# Patient Record
Sex: Male | Born: 1970
Health system: Southern US, Community
[De-identification: ages and names within clinical notes are randomized; demographics above are authoritative.]

## PROBLEM LIST (undated history)

## (undated) DIAGNOSIS — J449 Chronic obstructive pulmonary disease, unspecified: Secondary | ICD-10-CM

## (undated) DIAGNOSIS — I10 Essential (primary) hypertension: Secondary | ICD-10-CM

## (undated) DIAGNOSIS — J9692 Respiratory failure, unspecified with hypercapnia: Secondary | ICD-10-CM

## (undated) DIAGNOSIS — I509 Heart failure, unspecified: Secondary | ICD-10-CM

## (undated) HISTORY — DX: Chronic obstructive pulmonary disease, unspecified: J44.9

## (undated) HISTORY — DX: Heart failure, unspecified: I50.9

## (undated) HISTORY — PX: OTHER SURGICAL HISTORY: SHX169

## (undated) HISTORY — DX: Respiratory failure, unspecified with hypercapnia: J96.92

---

## 2015-06-01 ENCOUNTER — Inpatient Hospital Stay (HOSPITAL_BASED_OUTPATIENT_CLINIC_OR_DEPARTMENT_OTHER)
Admission: EM | Admit: 2015-06-01 | Discharge: 2015-06-11 | DRG: 291 | Disposition: A | Discharge status: Home / Self Care | Payer: Self-pay | Attending: Internal Medicine | Admitting: Internal Medicine

## 2015-06-01 ENCOUNTER — Encounter (HOSPITAL_BASED_OUTPATIENT_CLINIC_OR_DEPARTMENT_OTHER): Payer: Self-pay

## 2015-06-01 ENCOUNTER — Emergency Department (HOSPITAL_BASED_OUTPATIENT_CLINIC_OR_DEPARTMENT_OTHER): Payer: Self-pay

## 2015-06-01 DIAGNOSIS — I161 Hypertensive emergency: Secondary | ICD-10-CM | POA: Insufficient documentation

## 2015-06-01 DIAGNOSIS — R5383 Other fatigue: Secondary | ICD-10-CM | POA: Insufficient documentation

## 2015-06-01 DIAGNOSIS — I509 Heart failure, unspecified: Secondary | ICD-10-CM

## 2015-06-01 DIAGNOSIS — G4733 Obstructive sleep apnea (adult) (pediatric): Secondary | ICD-10-CM | POA: Diagnosis present

## 2015-06-01 DIAGNOSIS — R609 Edema, unspecified: Secondary | ICD-10-CM

## 2015-06-01 DIAGNOSIS — E662 Morbid (severe) obesity with alveolar hypoventilation: Secondary | ICD-10-CM | POA: Diagnosis present

## 2015-06-01 DIAGNOSIS — T380X5A Adverse effect of glucocorticoids and synthetic analogues, initial encounter: Secondary | ICD-10-CM | POA: Diagnosis present

## 2015-06-01 DIAGNOSIS — I160 Hypertensive urgency: Secondary | ICD-10-CM | POA: Diagnosis present

## 2015-06-01 DIAGNOSIS — I248 Other forms of acute ischemic heart disease: Secondary | ICD-10-CM | POA: Diagnosis present

## 2015-06-01 DIAGNOSIS — M7120 Synovial cyst of popliteal space [Baker], unspecified knee: Secondary | ICD-10-CM | POA: Diagnosis present

## 2015-06-01 DIAGNOSIS — J9601 Acute respiratory failure with hypoxia: Secondary | ICD-10-CM | POA: Diagnosis present

## 2015-06-01 DIAGNOSIS — R32 Unspecified urinary incontinence: Secondary | ICD-10-CM | POA: Diagnosis present

## 2015-06-01 DIAGNOSIS — F17200 Nicotine dependence, unspecified, uncomplicated: Secondary | ICD-10-CM | POA: Diagnosis present

## 2015-06-01 DIAGNOSIS — I110 Hypertensive heart disease with heart failure: Principal | ICD-10-CM | POA: Diagnosis present

## 2015-06-01 DIAGNOSIS — R601 Generalized edema: Secondary | ICD-10-CM

## 2015-06-01 DIAGNOSIS — I429 Cardiomyopathy, unspecified: Secondary | ICD-10-CM | POA: Diagnosis present

## 2015-06-01 DIAGNOSIS — D72829 Elevated white blood cell count, unspecified: Secondary | ICD-10-CM | POA: Diagnosis present

## 2015-06-01 DIAGNOSIS — I10 Essential (primary) hypertension: Secondary | ICD-10-CM

## 2015-06-01 DIAGNOSIS — I472 Ventricular tachycardia: Secondary | ICD-10-CM | POA: Diagnosis not present

## 2015-06-01 DIAGNOSIS — R0902 Hypoxemia: Secondary | ICD-10-CM | POA: Insufficient documentation

## 2015-06-01 DIAGNOSIS — J9602 Acute respiratory failure with hypercapnia: Secondary | ICD-10-CM

## 2015-06-01 DIAGNOSIS — Z720 Tobacco use: Secondary | ICD-10-CM

## 2015-06-01 DIAGNOSIS — J9621 Acute and chronic respiratory failure with hypoxia: Secondary | ICD-10-CM | POA: Diagnosis present

## 2015-06-01 DIAGNOSIS — Z9119 Patient's noncompliance with other medical treatment and regimen: Secondary | ICD-10-CM | POA: Diagnosis present

## 2015-06-01 DIAGNOSIS — I5043 Acute on chronic combined systolic (congestive) and diastolic (congestive) heart failure: Secondary | ICD-10-CM | POA: Diagnosis present

## 2015-06-01 DIAGNOSIS — I5041 Acute combined systolic (congestive) and diastolic (congestive) heart failure: Secondary | ICD-10-CM | POA: Diagnosis present

## 2015-06-01 DIAGNOSIS — I272 Other secondary pulmonary hypertension: Secondary | ICD-10-CM | POA: Diagnosis present

## 2015-06-01 DIAGNOSIS — I2781 Cor pulmonale (chronic): Secondary | ICD-10-CM | POA: Diagnosis present

## 2015-06-01 DIAGNOSIS — Z9114 Patient's other noncompliance with medication regimen: Secondary | ICD-10-CM | POA: Diagnosis present

## 2015-06-01 DIAGNOSIS — J9622 Acute and chronic respiratory failure with hypercapnia: Secondary | ICD-10-CM | POA: Diagnosis present

## 2015-06-01 DIAGNOSIS — F17210 Nicotine dependence, cigarettes, uncomplicated: Secondary | ICD-10-CM | POA: Diagnosis present

## 2015-06-01 DIAGNOSIS — J441 Chronic obstructive pulmonary disease with (acute) exacerbation: Secondary | ICD-10-CM | POA: Diagnosis present

## 2015-06-01 DIAGNOSIS — D751 Secondary polycythemia: Secondary | ICD-10-CM | POA: Diagnosis present

## 2015-06-01 HISTORY — DX: Essential (primary) hypertension: I10

## 2015-06-01 LAB — CBC WITH DIFFERENTIAL/PLATELET
BASOS ABS: 0.1 10*3/uL (ref 0.0–0.1)
BASOS PCT: 1 % (ref 0–1)
Eosinophils Absolute: 0.5 10*3/uL (ref 0.0–0.7)
Eosinophils Relative: 4 % (ref 0–5)
HEMOGLOBIN: 17.2 g/dL — AB (ref 13.0–17.0)
LYMPHS ABS: 3.1 10*3/uL (ref 0.7–4.0)
LYMPHS PCT: 25 % (ref 12–46)
MCH: 29.6 pg (ref 26.0–34.0)
MCHC: 31 g/dL (ref 30.0–36.0)
MCV: 95.2 fL (ref 78.0–100.0)
Monocytes Absolute: 1.2 10*3/uL — ABNORMAL HIGH (ref 0.1–1.0)
Monocytes Relative: 9 % (ref 3–12)
NEUTROS PCT: 61 % (ref 43–77)
Neutro Abs: 7.6 10*3/uL (ref 1.7–7.7)
PLATELETS: 229 10*3/uL (ref 150–400)
RBC: 5.82 MIL/uL — ABNORMAL HIGH (ref 4.22–5.81)
RDW: 15.6 % — ABNORMAL HIGH (ref 11.5–15.5)
WBC: 12.4 10*3/uL — AB (ref 4.0–10.5)

## 2015-06-01 LAB — URINALYSIS, ROUTINE W REFLEX MICROSCOPIC
BILIRUBIN URINE: NEGATIVE
Glucose, UA: NEGATIVE mg/dL
Hgb urine dipstick: NEGATIVE
Ketones, ur: NEGATIVE mg/dL
Leukocytes, UA: NEGATIVE
Nitrite: NEGATIVE
PH: 6.5 (ref 5.0–8.0)
PROTEIN: 30 mg/dL — AB
SPECIFIC GRAVITY, URINE: 1.009 (ref 1.005–1.030)
UROBILINOGEN UA: 0.2 mg/dL (ref 0.0–1.0)

## 2015-06-01 LAB — BRAIN NATRIURETIC PEPTIDE: B Natriuretic Peptide: 315.6 pg/mL — ABNORMAL HIGH (ref 0.0–100.0)

## 2015-06-01 LAB — COMPREHENSIVE METABOLIC PANEL
ALT: 43 U/L (ref 17–63)
ANION GAP: 17 — AB (ref 5–15)
AST: 40 U/L (ref 15–41)
Albumin: 3.3 g/dL — ABNORMAL LOW (ref 3.5–5.0)
Alkaline Phosphatase: 79 U/L (ref 38–126)
BILIRUBIN TOTAL: 0.8 mg/dL (ref 0.3–1.2)
BUN: 20 mg/dL (ref 6–20)
CO2: 36 mmol/L — ABNORMAL HIGH (ref 22–32)
Calcium: 9.8 mg/dL (ref 8.9–10.3)
Chloride: 92 mmol/L — ABNORMAL LOW (ref 101–111)
Creatinine, Ser: 0.86 mg/dL (ref 0.61–1.24)
GFR calc Af Amer: >60 mL/min (ref >60)
GLUCOSE: 110 mg/dL — AB (ref 65–99)
Potassium: 4.3 mmol/L (ref 3.5–5.1)
Sodium: 145 mmol/L (ref 135–145)
Total Protein: 6.2 g/dL — ABNORMAL LOW (ref 6.5–8.1)

## 2015-06-01 LAB — TROPONIN I: TROPONIN I: 0.07 ng/mL — AB (ref <0.031)

## 2015-06-01 LAB — URINE MICROSCOPIC-ADD ON: Urine-Other: NONE SEEN

## 2015-06-01 MED ORDER — NITROGLYCERIN IN D5W 200-5 MCG/ML-% IV SOLN
10.0000 ug/min | INTRAVENOUS | Status: DC
  Administered 2015-06-01: 5 ug/min via INTRAVENOUS
  Administered 2015-06-02: 20 ug/min via INTRAVENOUS
  Filled 2015-06-01 (×2): qty 250

## 2015-06-01 MED ORDER — NICOTINE 21 MG/24HR TD PT24
21.0000 mg | MEDICATED_PATCH | Freq: Every day | TRANSDERMAL | Status: DC
  Administered 2015-06-01 – 2015-06-11 (×11): 21 mg via TRANSDERMAL
  Filled 2015-06-01 (×12): qty 1

## 2015-06-01 MED ORDER — FUROSEMIDE 10 MG/ML IJ SOLN
40.0000 mg | Freq: Once | INTRAMUSCULAR | Status: AC
  Administered 2015-06-01: 40 mg via INTRAVENOUS
  Filled 2015-06-01: qty 4

## 2015-06-01 MED ORDER — SODIUM CHLORIDE 0.9 % IV SOLN
INTRAVENOUS | Status: DC
  Administered 2015-06-01: 21:00:00 via INTRAVENOUS

## 2015-06-01 MED ORDER — HYDRALAZINE HCL 20 MG/ML IJ SOLN
10.0000 mg | Freq: Once | INTRAMUSCULAR | Status: AC
  Administered 2015-06-01: 10 mg via INTRAVENOUS
  Filled 2015-06-01: qty 1

## 2015-06-01 MED ORDER — ONDANSETRON HCL 4 MG/2ML IJ SOLN: 4.0000 mg | Freq: Three times a day (TID) | INTRAMUSCULAR | Status: DC | PRN

## 2015-06-01 MED ORDER — ASPIRIN 81 MG PO CHEW
324.0000 mg | CHEWABLE_TABLET | Freq: Once | ORAL | Status: AC
  Administered 2015-06-01: 324 mg via ORAL
  Filled 2015-06-01: qty 4

## 2015-06-01 MED ORDER — OXYCODONE-ACETAMINOPHEN 5-325 MG PO TABS
2.0000 | ORAL_TABLET | Freq: Once | ORAL | Status: AC
  Administered 2015-06-02: 2 via ORAL
  Filled 2015-06-01: qty 2

## 2015-06-01 NOTE — ED Provider Notes (Addendum)
Patient seen and evaluated. Care discussed with PA. Has peripheral edema. Dyspnea with conversation. 80% sats on room air. Wheezing on exam. His dry weight 284 he is 299 tonight. I agree with admission diuresis and blood pressure control. He is placed on nitroglycerin drip. Given IV Lasix. Given IV hydralazine. I discussed the case with Dr. Julian Reil. My PA has discussed the case with Dr. Nicholaus Bloom of cardiology. Plan will be transferred to [facility] for admission, further testing, cardiology consultation.  CRITICAL CARE Performed by: Rolland Porter JOSEPH   Total critical care time: 30 minutes.  Initiation of NTG qtt for HTN Urgency  Critical care time was exclusive of separately billable procedures and treating other patients.  Critical care was necessary to treat or prevent imminent or life-threatening deterioration.  Critical care was time spent personally by me on the following activities: development of treatment plan with patient and/or surrogate as well as nursing, discussions with consultants, evaluation of patient's response to treatment, examination of patient, obtaining history from patient or surrogate, ordering and performing treatments and interventions, ordering and review of laboratory studies, ordering and review of radiographic studies, pulse oximetry and re-evaluation of patient's condition.   Rolland Porter, MD 06/01/15 2137  Rolland Porter, MD 06/05/15 [phone]

## 2015-06-01 NOTE — ED Notes (Signed)
Pt c/o bilateral leg swelling with sharp throbbing pain from knee down; pts o2 sat 84% ra, states no medical hx, doesn't go to PCP

## 2015-06-01 NOTE — ED Provider Notes (Signed)
CSN: [id number]     Arrival date & time 06/01/15  1910 History   First MD Initiated Contact with Patient 06/01/15 1928     Chief Complaint  Patient presents with  . Leg Swelling    o2 sat 84, c/o SOB on exertion      (Consider location/radiation/quality/duration/timing/severity/associated sxs/prior Treatment) HPI   Blood pressure 192/117, pulse 96, temperature 98.7 F (37.1 C), temperature source Oral, resp. rate 11, SpO2 93 %.  Bob Rodriguez is a 44 y.o. male complaining of increasing bilateral lower extremity peripheral edema with weeping, states that he has pain from the knee down which she described as throbbing. Does not have a primary care physician. Patient denies chest pain, PND, orthopnea, nausea, vomiting. States that he has had episodes of urinary incontinence only when he sleeps at night. Patient states that he always has a swollen left lower extremity due to trauma 3 years ago but the right lower extremity started swelling 3-4 days ago. He has a productive cough which is typical for him, 2 pack-a-day smoker since he was 44 years old. States that he saw a doctor once, 9 months ago, they told him he had hypertension and started him on 2 medications, he did not refill the medications or follow-up after he ran out approximately 3 months ago. States that when he scratches his lower extremities, clear fluid comes out but no blood  Past Medical History  Diagnosis Date  . Hypertension    History reviewed. No pertinent past surgical history. No family history on file. History  Substance Use Topics  . Smoking status: Current Some Day Smoker  . Smokeless tobacco: Not on file  . Alcohol Use: No    Review of Systems  10 systems reviewed and found to be negative, except as noted in the HPI.  Allergies  Codeine  Home Medications   Prior to Admission medications   Not on File   BP 150/92 mmHg  Pulse 96  Temp(Src) 98.7 F (37.1 C) (Oral)  Resp 11  Wt 299 lb (135.626 kg)   SpO2 84% Physical Exam  Constitutional: He is oriented to person, place, and time. He appears well-developed and well-nourished. No distress.  HENT:  Head: Normocephalic.  Mouth/Throat: Oropharynx is clear and moist.  Eyes: Conjunctivae and EOM are normal.  Cardiovascular: Normal rate, regular rhythm and intact distal pulses.   Pulmonary/Chest: Effort normal and breath sounds normal. No stridor. No respiratory distress. He has no wheezes. He exhibits no tenderness.  Mild crackles at the bases bilaterally  Abdominal: Soft. Bowel sounds are normal. He exhibits distension. He exhibits no mass. There is no tenderness. There is no rebound and no guarding.  Musculoskeletal: Normal range of motion. He exhibits edema and tenderness.  4+ bilateral pitting edema to upper shin, actively weeping  Neurological: He is alert and oriented to person, place, and time.  Psychiatric: He has a normal mood and affect.  Nursing note and vitals reviewed.   ED Course  Procedures (including critical care time)  CRITICAL CARE Performed by: Wynetta Emery   Total critical care time: 55  Critical care time was exclusive of separately billable procedures and treating other patients.  Critical care was necessary to treat or prevent imminent or life-threatening deterioration.  Critical care was time spent personally by me on the following activities: development of treatment plan with patient and/or surrogate as well as nursing, discussions with consultants, evaluation of patient's response to treatment, examination of patient, obtaining history from patient  or surrogate, ordering and performing treatments and interventions, ordering and review of laboratory studies, ordering and review of radiographic studies, pulse oximetry and re-evaluation of patient's condition.  Labs Review Labs Reviewed  TROPONIN I - Abnormal; Notable for the following:    Troponin I 0.07 (*)    All other components within normal  limits  BRAIN NATRIURETIC PEPTIDE - Abnormal; Notable for the following:    B Natriuretic Peptide 315.6 (*)    All other components within normal limits  CBC WITH DIFFERENTIAL/PLATELET - Abnormal; Notable for the following:    WBC 12.4 (*)    RBC 5.82 (*)    Hemoglobin 17.2 (*)    HCT >55.0 (*)    RDW 15.6 (*)    Monocytes Absolute 1.2 (*)    All other components within normal limits  COMPREHENSIVE METABOLIC PANEL - Abnormal; Notable for the following:    Chloride 92 (*)    CO2 36 (*)    Glucose, Bld 110 (*)    Total Protein 6.2 (*)    Albumin 3.3 (*)    Anion gap 17 (*)    All other components within normal limits  URINALYSIS, ROUTINE W REFLEX MICROSCOPIC (NOT AT [facility])    Imaging Review Dg Chest Port 1 View  06/01/2015   CLINICAL DATA:  Hypoxia for 6 months. Bilateral lower extremity swelling today.  EXAM: PORTABLE CHEST - 1 VIEW  COMPARISON:  None.  FINDINGS: There is cardiomegaly and pulmonary vascular congestion. No consolidative process, pneumothorax or effusion.  IMPRESSION: Cardiomegaly without acute disease.   Electronically Signed   By: Drusilla Kanner M.D.   On: 06/01/2015 20:16     EKG Interpretation None      MDM   Final diagnoses:  Hypoxia  Anasarca  Tobacco use disorder  Hypertension, uncontrolled  Non compliance w medication regimen  Hypertensive emergency   Filed Vitals:   06/01/15 1949 06/01/15 1950 06/01/15 2002 06/01/15 2030  BP: 192/117   150/92  Pulse: 97 96  96  Temp:      TempSrc:      Resp: 23 11    Weight:   299 lb (135.626 kg)   SpO2: 95% 93%  84%    Medications  nicotine (NICODERM CQ - dosed in mg/24 hours) patch 21 mg (21 mg Transdermal Patch Applied 06/01/15 2006)  furosemide (LASIX) injection 40 mg (not administered)  nitroGLYCERIN 50 mg in dextrose 5 % 250 mL (0.2 mg/mL) infusion (not administered)  aspirin chewable tablet 324 mg (not administered)  0.9 %  sodium chloride infusion (not administered)  hydrALAZINE (APRESOLINE)  injection 10 mg (10 mg Intravenous Given 06/01/15 2008)    Bob Rodriguez is a pleasant 44 y.o. male presenting with lateral lower extremity edema, worsening over the last several days. Hypoxic to 84% on room air, responds well to nasal cannula. Patient has untreated hypertension: noncompliant with his high blood pressure medication, he is not insured. Patient denies any chest pain shortness of breath. His EKG shows a right axis deviation, sinus tachycardia with no ischemic changes. Patient is started on Lasix, given hydralazine for his elevated blood pressure. Patient has a normal creatinine, his albumen level was essentially normal at 3.3, proBNP is not significantly elevated at 315.  Hydralazine  has brought his blood pressure to 150/92.  Troponin is elevated 0.07, this is likely a troponin leak, case discussed with cardiologist Dr. Tresa Endo who will evaluate the patient once he is admitted to internal medicine at [facility]. Case  discussed with triad hospitalist Dr. Julian Reil who accepts admission and transfer to [facility].  This is a shared visit with the attending physician who personally evaluated the patient and agrees with the care plan.          Wynetta Emery, PA-C 06/01/15 2132  Rolland Porter, MD 06/05/15 [phone]

## 2015-06-01 NOTE — Consult Note (Signed)
Reason for Consult: anasarca, heart failure Primary Cardiologist: new  Referring Physician: Monico Blitz   Bob Rodriguez is an 44 y.o. male.  HPI: Mr. Bob Rodriguez is a 44 yo man with PMH of recently diagnosed hypertension, 2 pack per day smoker (since age 41) for 9 years, obesity who presented to [facility] with leg discomfort related to bilateral lower extremity edema with weeping wounds. He describes a traumatic episode many years ago leading to right leg swelling that is intermittent (fell off a roof). He says he has had some intermittent leg swelling previously on the right leg but he really just noticed the left leg and increased swelling over the last 1-2 weeks. He works in [employer] and did start a job around this same timing and is feeling more fatigued than usual with work. However, he previously had been out of work for over a year. He denies infectious symptoms, no recent travel. He's divorced with three grown children. He des[place] while [place]     He also denies chest pain, orthopnea/PND associated with these symptoms.    Past Medical History  Diagnosis Date  . Hypertension     History reviewed. No pertinent past surgical history.  No family history on file. Father with 3 MIs leading to death; uncle with significant CAD Social History:  reports that he has been smoking.  He does not have any smokeless tobacco history on file. He reports that he does not drink alcohol. His drug history is not on file.  Allergies:  Allergies  Allergen Reactions  . Codeine     Medications:  I have reviewed the patient's current medications. Prior to Admission:  (Not in a hospital admission) Scheduled: . nicotine  21 mg Transdermal Daily    Results for orders placed or performed during the hospital encounter of 06/01/15 (from the past 48 hour(s))  Troponin I     Status: Abnormal   Collection Time: 06/01/15  7:40 PM    Result Value Ref Range   Troponin I 0.07 (H) <0.031 ng/mL    Comment:        PERSISTENTLY INCREASED TROPONIN VALUES IN THE RANGE OF 0.04-0.49 ng/mL CAN BE SEEN IN:       -UNSTABLE ANGINA       -CONGESTIVE HEART FAILURE       -MYOCARDITIS       -CHEST TRAUMA       -ARRYHTHMIAS       -LATE PRESENTING MYOCARDIAL INFARCTION       -COPD   CLINICAL FOLLOW-UP RECOMMENDED.   Brain natriuretic peptide     Status: Abnormal   Collection Time: 06/01/15  7:40 PM  Result Value Ref Range   B Natriuretic Peptide 315.6 (H) 0.0 - 100.0 pg/mL  CBC with Differential     Status: Abnormal   Collection Time: 06/01/15  7:40 PM  Result Value Ref Range   WBC 12.4 (H) 4.0 - 10.5 K/uL   RBC 5.82 (H) 4.22 - 5.81 MIL/uL   Hemoglobin 17.2 (H) 13.0 - 17.0 g/dL   HCT >55.0 (H) 39.0 - 52.0 %   MCV 95.2 78.0 - 100.0 fL   MCH 29.6 26.0 - 34.0 pg   MCHC 31.0 30.0 - 36.0 g/dL   RDW 15.6 (H) 11.5 - 15.5 %   Platelets 229 150 - 400 K/uL   Neutrophils Relative % 61 43 - 77 %   Neutro Abs 7.6 1.7 - 7.7 K/uL  Lymphocytes Relative 25 12 - 46 %   Lymphs Abs 3.1 0.7 - 4.0 K/uL   Monocytes Relative 9 3 - 12 %   Monocytes Absolute 1.2 (H) 0.1 - 1.0 K/uL   Eosinophils Relative 4 0 - 5 %   Eosinophils Absolute 0.5 0.0 - 0.7 K/uL   Basophils Relative 1 0 - 1 %   Basophils Absolute 0.1 0.0 - 0.1 K/uL  Comprehensive metabolic panel     Status: Abnormal   Collection Time: 06/01/15  7:40 PM  Result Value Ref Range   Sodium 145 135 - 145 mmol/L   Potassium 4.3 3.5 - 5.1 mmol/L   Chloride 92 (L) 101 - 111 mmol/L   CO2 36 (H) 22 - 32 mmol/L   Glucose, Bld 110 (H) 65 - 99 mg/dL   BUN 20 6 - 20 mg/dL   Creatinine, Ser 0.86 0.61 - 1.24 mg/dL   Calcium 9.8 8.9 - 10.3 mg/dL   Total Protein 6.2 (L) 6.5 - 8.1 g/dL   Albumin 3.3 (L) 3.5 - 5.0 g/dL   AST 40 15 - 41 U/L   ALT 43 17 - 63 U/L   Alkaline Phosphatase 79 38 - 126 U/L   Total Bilirubin 0.8 0.3 - 1.2 mg/dL   GFR calc non Af Amer >60 >60 mL/min   GFR calc Af Amer  >60 >60 mL/min    Comment: (NOTE) The eGFR has been calculated using the CKD EPI equation. This calculation has not been validated in all clinical situations. eGFR's persistently <60 mL/min signify possible Chronic Kidney Disease.    Anion gap 17 (H) 5 - 15    Dg Chest Port 1 View  06/01/2015   CLINICAL DATA:  Hypoxia for 6 months. Bilateral lower extremity swelling today.  EXAM: PORTABLE CHEST - 1 VIEW  COMPARISON:  None.  FINDINGS: There is cardiomegaly and pulmonary vascular congestion. No consolidative process, pneumothorax or effusion.  IMPRESSION: Cardiomegaly without acute disease.   Electronically Signed   By: Inge Rise M.D.   On: 06/01/2015 20:16    Review of Systems  Constitutional: Positive for malaise/fatigue. Negative for fever, chills, weight loss and diaphoresis.  HENT: Negative for ear pain and tinnitus.   Eyes: Negative for double vision and photophobia.  Respiratory: Positive for cough and shortness of breath. Negative for hemoptysis and sputum production.   Cardiovascular: Positive for chest pain and leg swelling. Negative for palpitations and orthopnea.  Gastrointestinal: Negative for nausea, vomiting and abdominal pain.  Genitourinary: Negative for dysuria, frequency and hematuria.  Musculoskeletal: Positive for back pain. Negative for myalgias and neck pain.  Skin: Negative for rash.  Neurological: Negative for dizziness, tingling, tremors and headaches.  Endo/Heme/Allergies: Negative for polydipsia. Does not bruise/bleed easily.  Psychiatric/Behavioral: Negative for depression, suicidal ideas and hallucinations.   Blood pressure 165/111, pulse 99, temperature 98.7 F (37.1 C), temperature source Oral, resp. rate 11, weight 135.626 kg (299 lb), SpO2 91 %. Physical Exam  Nursing note and vitals reviewed. Constitutional: He is oriented to person, place, and time. He appears well-developed and well-nourished. No distress.  HENT:  Head: Normocephalic and  atraumatic.  Nose: Nose normal.  Mouth/Throat: Oropharynx is clear and moist. No oropharyngeal exudate.  Eyes: Conjunctivae and EOM are normal. Pupils are equal, round, and reactive to light. No scleral icterus.  Neck: Normal range of motion. Neck supple. JVD present. No tracheal deviation present.  JVP to 2 cm under mandible  Cardiovascular: Normal rate, regular rhythm, normal heart sounds and  intact distal pulses.  Exam reveals no gallop.   No murmur heard. GI: Soft. Bowel sounds are normal. He exhibits no distension. There is no tenderness. There is no rebound.  Musculoskeletal: Normal range of motion. He exhibits edema. He exhibits no tenderness.  Neurological: He is alert and oriented to person, place, and time. He has normal reflexes. No cranial nerve deficit. Coordination normal.  Skin: Skin is warm and dry. He is not diaphoretic. No erythema.  Chronic skin changes in bilateral lower extremities  Psychiatric: He has a normal mood and affect. His behavior is normal.   Labs reviewed; wbc 12.4, h/h 17.2/55, plt 229, K 4.3, na 145, bun/cr 20/0.89, glucose 110 Trop 0.07, BNP 316 84% RA Chest x-ray: cardiomegaly and increased vascular congestion  Assessment/Plan: Mr. Bob Rodriguez is a 44 yo man with PMH of recently diagnosed hypertension, 2 pack per day smoker (since age 89) for 2 years, obesity who presented to [facility] with leg discomfort related to bilateral lower extremity edema with weeping wounds and diffuse anasarca. Differential diagnosis for anasarca includes heart failure, pulmonary hypertension, hypoalbuminemia, nephrotic syndrome, cirrhosis, venous thrombosis, hypothyroidism among many other etiologies. I favor a diagnosis of hypertensive heart failure; however, he also has erythrocytosis which may be related to chronic hypoxia from smoking or hemochromatosis (could also have JAK mutation).  Problem List Bilateral lower extremity edema Anasarca Erythrocytosis -  hemoglobin/hematocrit 17/55 Troponinemia - Demand Ischemia  Elevated BNP Hypoxia Tobacco abuse Leukocytosis  Hypertension  Plan:   1. Trend cardiac biomarkers, place on telemetry; 40 mg IV lasix bid, replete electrolytes 2. CMP to evaluate liver panel; if abnormalities would add on abdominal ultrasound 3. Echocardiogram in AM; if EF reduced, will need ischemic evaluation  4. Agree with improved blood pressure control; favor NTG and hydralazine while diuresing; based on echo and EF to determine longer term medication strategy 5. TSH, iron studies, HIV, SPEP, ANA to start evaluation for other causes of cardiomyopathy and new onset heart failure 6. Daily tobacco cessation counseling; ok to offer nicotine replacement   Bob Rodriguez 06/01/2015, 9:13 PM

## 2015-06-02 ENCOUNTER — Inpatient Hospital Stay (HOSPITAL_COMMUNITY): Payer: Self-pay

## 2015-06-02 DIAGNOSIS — R609 Edema, unspecified: Secondary | ICD-10-CM

## 2015-06-02 DIAGNOSIS — I509 Heart failure, unspecified: Secondary | ICD-10-CM

## 2015-06-02 LAB — TROPONIN I
TROPONIN I: 0.05 ng/mL — AB (ref <0.031)
Troponin I: 0.05 ng/mL — ABNORMAL HIGH (ref <0.031)
Troponin I: 0.05 ng/mL — ABNORMAL HIGH (ref <0.031)

## 2015-06-02 LAB — HIV ANTIBODY (ROUTINE TESTING W REFLEX): HIV Screen 4th Generation wRfx: NONREACTIVE

## 2015-06-02 LAB — IRON AND TIBC
Iron: 49 ug/dL (ref 45–182)
Saturation Ratios: 10 % — ABNORMAL LOW (ref 17.9–39.5)
TIBC: 484 ug/dL — AB (ref 250–450)
UIBC: 435 ug/dL

## 2015-06-02 LAB — TSH: TSH: 1.362 u[IU]/mL (ref 0.350–4.500)

## 2015-06-02 LAB — FERRITIN: Ferritin: 27 ng/mL (ref 24–336)

## 2015-06-02 LAB — MRSA PCR SCREENING: MRSA by PCR: NEGATIVE

## 2015-06-02 MED ORDER — ONDANSETRON HCL 4 MG/2ML IJ SOLN
4.0000 mg | Freq: Four times a day (QID) | INTRAMUSCULAR | Status: DC | PRN
Start: 2015-06-02 — End: 2015-06-11

## 2015-06-02 MED ORDER — FUROSEMIDE 10 MG/ML IJ SOLN
40.0000 mg | Freq: Two times a day (BID) | INTRAMUSCULAR | Status: DC
  Administered 2015-06-02 – 2015-06-10 (×17): 40 mg via INTRAVENOUS
  Filled 2015-06-02 (×24): qty 4

## 2015-06-02 MED ORDER — SODIUM CHLORIDE 0.9 % IJ SOLN: 3.0000 mL | INTRAMUSCULAR | Status: DC | PRN

## 2015-06-02 MED ORDER — ACETAMINOPHEN 325 MG PO TABS
650.0000 mg | ORAL_TABLET | ORAL | Status: DC | PRN
  Administered 2015-06-02 – 2015-06-11 (×11): 650 mg via ORAL
  Filled 2015-06-02 (×11): qty 2

## 2015-06-02 MED ORDER — CETYLPYRIDINIUM CHLORIDE 0.05 % MT LIQD
7.0000 mL | Freq: Two times a day (BID) | OROMUCOSAL | Status: DC
  Administered 2015-06-02 – 2015-06-11 (×18): 7 mL via OROMUCOSAL

## 2015-06-02 MED ORDER — HYDRALAZINE HCL 20 MG/ML IJ SOLN
10.0000 mg | INTRAMUSCULAR | Status: DC | PRN
  Administered 2015-06-02 – 2015-06-04 (×2): 10 mg via INTRAVENOUS
  Filled 2015-06-02 (×2): qty 1

## 2015-06-02 MED ORDER — MORPHINE SULFATE 2 MG/ML IJ SOLN: 1.0000 mg | INTRAMUSCULAR | Status: DC | PRN

## 2015-06-02 MED ORDER — CALCIUM CARBONATE ANTACID 500 MG PO CHEW
1.0000 | CHEWABLE_TABLET | Freq: Three times a day (TID) | ORAL | Status: DC | PRN
  Administered 2015-06-02 – 2015-06-03 (×2): 400 mg via ORAL
  Administered 2015-06-03: 200 mg via ORAL
  Administered 2015-06-07 – 2015-06-08 (×2): 400 mg via ORAL
  Filled 2015-06-02 (×8): qty 2

## 2015-06-02 MED ORDER — LISINOPRIL 5 MG PO TABS
5.0000 mg | ORAL_TABLET | Freq: Every day | ORAL | Status: DC
  Administered 2015-06-02: 5 mg via ORAL
  Filled 2015-06-02 (×2): qty 1

## 2015-06-02 MED ORDER — SODIUM CHLORIDE 0.9 % IJ SOLN
3.0000 mL | Freq: Two times a day (BID) | INTRAMUSCULAR | Status: DC
  Administered 2015-06-02 – 2015-06-10 (×17): 3 mL via INTRAVENOUS

## 2015-06-02 MED ORDER — HEPARIN SODIUM (PORCINE) 5000 UNIT/ML IJ SOLN
5000.0000 [IU] | Freq: Three times a day (TID) | INTRAMUSCULAR | Status: DC
  Administered 2015-06-02 – 2015-06-11 (×29): 5000 [IU] via SUBCUTANEOUS
  Filled 2015-06-02 (×35): qty 1

## 2015-06-02 MED ORDER — SODIUM CHLORIDE 0.9 % IV SOLN: 250.0000 mL | INTRAVENOUS | Status: DC | PRN

## 2015-06-02 NOTE — Care Management Note (Addendum)
Case Management Note  Patient Details  Name: Bob Rodriguez MRN: [id number] Date of Birth: 1971-10-15  Subjective/Objective:       Pt is self-employed, has no PCP, no insurance.  Requested follow-up @ [facility] and [facility], appointment arranged for Thursday, August 4th @ 10:30.  Pt given brochure with appointment time and instructions.  He will qualify for [facility] program when discharged.  Currently on lasix and lisinopril which are both available through [facility] for $4 for 30-day supply, $10 for 90-day supply.                       Expected Discharge Plan:  Home/Self Care  In-House Referral:  Financial Counselor   Discharge planning Services  CM Consult, Follow-up appt scheduled, [facility], [facility]  Status of Service:  In process, will continue to follow  Magdalene River, RN 06/02/2015, 3:12 PM  Referral to [facility] is on shadow chart.

## 2015-06-02 NOTE — Progress Notes (Signed)
Utilization Review Completed.  

## 2015-06-02 NOTE — Progress Notes (Signed)
Pt arrived to 2614 off BIPAP and on 4 LPM [place].  Pt currently tolerating well at this time, Pt is alert and oriented.  RT will leave Pt off BIPAP at this time, RT to monitor and assess as needed.

## 2015-06-02 NOTE — Progress Notes (Signed)
[facility] PROGRESS NOTE  Bob Rodriguez:[id number] DOB: 1971-08-01 DOA: 06/01/2015  PCP: No primary care provider on file.  Brief HPI: 44 year old Caucasian male with a past medical history of hypertension, presented with complains of lower extremity edema. In the emergency department he was noted to be hypoxic in the 80s. He was noted to desaturate while sleeping. He was placed on BiPAP. He was hospitalized for further management.  Past medical history:  Past Medical History  Diagnosis Date  . Hypertension     Consultants: Cardiology  Procedures:  2-D echocardiogram is pending  Lower extremity venous Dopplers Negative for DVT. Baker cyst noted on the right.  Antibiotics: None  Subjective: Patient complains of abdominal cramping this morning. Subsequently, he burped and felt better. Denies any chest pain or shortness of breath. Feels about the same as yesterday. Continues to have swelling of his legs.  Objective: Vital Signs  Filed Vitals:   06/02/15 0900 06/02/15 1100 06/02/15 1200 06/02/15 1217  BP: 156/107 117/64 132/82 119/68  Pulse: 99 74 89 86  Temp:    98.2 F (36.8 C)  TempSrc:    Oral  Resp: 13 14 20 14   Height:      Weight:      SpO2:    94%    Intake/Output Summary (Last 24 hours) at 06/02/15 1315 Last data filed at 06/02/15 1023  Gross per 24 hour  Intake 782.43 ml  Output   5075 ml  Net -4292.57 ml   Filed Weights   06/01/15 2002 06/02/15 0400  Weight: 135.626 kg (299 lb) 133.8 kg (294 lb 15.6 oz)    General appearance: alert, cooperative, appears stated age, no distress and morbidly obese Head: Normocephalic, without obvious abnormality, atraumatic Resp: Diminished air entry at the bases without any crackles or wheezing. Cardio: regular rate and rhythm, S1, S2 normal, no murmur, click, rub or gallop GI: soft, non-tender; bowel sounds normal; no masses,  no organomegaly Extremities: 2+ edema bilateral lower extremities. Pulses:  2+ and symmetric Neurologic: No focal deficits  Lab Results:  Basic Metabolic Panel:  Recent Labs Lab 06/01/15 1940  NA 145  K 4.3  CL 92*  CO2 36*  GLUCOSE 110*  BUN 20  CREATININE 0.86  CALCIUM 9.8   Liver Function Tests:  Recent Labs Lab 06/01/15 1940  AST 40  ALT 43  ALKPHOS 79  BILITOT 0.8  PROT 6.2*  ALBUMIN 3.3*   CBC:  Recent Labs Lab 06/01/15 1940  WBC 12.4*  NEUTROABS 7.6  HGB 17.2*  HCT >55.0*  MCV 95.2  PLT 229   Cardiac Enzymes:  Recent Labs Lab 06/01/15 1940 06/02/15 0439 06/02/15 1150  TROPONINI 0.07* 0.05* 0.05*   BNP (last 3 results)  Recent Labs  06/01/15 1940  BNP 315.6*     Recent Results (from the past 240 hour(s))  MRSA PCR Screening     Status: None   Collection Time: 06/02/15  3:52 AM  Result Value Ref Range Status   MRSA by PCR NEGATIVE NEGATIVE Final    Comment:        The GeneXpert MRSA Assay (FDA approved for NASAL specimens only), is one component of a comprehensive MRSA colonization surveillance program. It is not intended to diagnose MRSA infection nor to guide or monitor treatment for MRSA infections.       Studies/Results: Dg Chest Port 1 View  06/01/2015   CLINICAL DATA:  Hypoxia for 6 months. Bilateral lower extremity swelling today.  EXAM:  PORTABLE CHEST - 1 VIEW  COMPARISON:  None.  FINDINGS: There is cardiomegaly and pulmonary vascular congestion. No consolidative process, pneumothorax or effusion.  IMPRESSION: Cardiomegaly without acute disease.   Electronically Signed   By: Drusilla Kanner M.D.   On: 06/01/2015 20:16    Medications:  Scheduled: . antiseptic oral rinse  7 mL Mouth Rinse BID  . furosemide  40 mg Intravenous BID  . heparin  5,000 Units Subcutaneous 3 times per day  . lisinopril  5 mg Oral Daily  . nicotine  21 mg Transdermal Daily  . sodium chloride  3 mL Intravenous Q12H   Continuous: . nitroGLYCERIN 20 mcg/min (06/02/15 0351)   WUJ:WJXBJY chloride, acetaminophen,  calcium carbonate, hydrALAZINE, morphine injection, ondansetron (ZOFRAN) IV, sodium chloride  Assessment/Plan:  Active Problems:   Hypertensive urgency   Acute respiratory failure with hypoxia   Acute CHF    Acute respiratory failure with hypoxia Thought to be secondary to congestive heart failure. X-ray did not show any florid pulmonary edema. There likely is a component of COPD and obesity hypoventilation. Take him off of BiPAP today and place him on nasal cannula oxygen. Continue to monitor closely for now.  Presumed acute congestive heart failure with lower extremity edema Unclear if this is diastolic or systolic. Echocardiogram is pending. There could also be a component of pulmonary hypertension. Continue diuresis. Monitor ins and outs. Daily weights. TSH is normal. Patient will likely need outpatient sleep study.  Hypertensive urgency Continue nitroglycerin infusion. Blood pressure is improved. Slowly wean off. Started on lisinopril.  Mildly elevated troponin Likely secondary to demand ischemia. Further management per cardiology.  Polycythemia Likely secondary due to smoking.  Tobacco abuse Nicotine patch  DVT Prophylaxis: Subcutaneous heparin    Code Status: Full code  Family Communication: Discussed with the patient and his daughter.  Disposition Plan: Possible transfer to the floor later today or tomorrow.  Follow-up Appointment?: Unclear if he has a PCP.   LOS: 1 day   [facility]  [facility] Pager [phone] 06/02/2015, 1:15 PM  If 7PM-7AM, please contact night-coverage at [email or web address], password [facility]

## 2015-06-02 NOTE — H&P (Addendum)
[facility] History and Physical  Bob Rodriguez RUE:[id number] DOB: October 16, 1971 DOA: 06/01/2015  Referring physician: EDP PCP: No primary care provider on file.   Chief Complaint: SOB, leg swelling   HPI: Bob Rodriguez is a 44 y.o. male who presents to the ED with c/o BLE edema and weeping.  Patient has had pain in BLE from knee down which he describes as throbbing.  Does not have a PCP.  No chest pain.  No orthopnea, N/V.  Always had swollen LLE due to trauma 3 years ago but RLE started swelling 3-4 days ago.  He has a productive cough which is typical for him.  Smokes 2 PPD x 30 years.  Saw doctor 9 months ago who told him he had HTN and put him on 2 BP meds but he didn't refill those meds after he ran out 3 months ago.  Today in ED he is persistently hypoxic on room air to the 80s.  Is able to maintain sats with [place] without trouble when awake.  Does desat when asleep, so they put him on BIPAP while asleep and awaiting transport in the ED at [facility].  Review of Systems: Systems reviewed.  As above, otherwise negative  Past Medical History  Diagnosis Date  . Hypertension    History reviewed. No pertinent past surgical history. Social History:  reports that he has been smoking.  He does not have any smokeless tobacco history on file. He reports that he does not drink alcohol. His drug history is not on file.  Allergies  Allergen Reactions  . Codeine     No family history on file.   Prior to Admission medications   Not on File   Physical Exam: Filed Vitals:   06/02/15 0400  BP: 175/109  Pulse: 93  Temp: 98 F (36.7 C)  Resp: 21    BP 175/109 mmHg  Pulse 93  Temp(Src) 98 F (36.7 C) (Oral)  Resp 21  Ht  (1.702 m)  Wt 133.8 kg (294 lb 15.6 oz)  BMI 46.19 kg/m2  SpO2 94%  General Appearance:    Alert, oriented, no distress, appears stated age  Head:    Normocephalic, atraumatic  Eyes:    PERRL, EOMI, sclera non-icteric        Nose:   Nares without drainage  or epistaxis. Mucosa, turbinates normal  Throat:   Moist mucous membranes. Oropharynx without erythema or exudate.  Neck:   Supple. No carotid bruits.  No thyromegaly.  No lymphadenopathy.   Back:     No CVA tenderness, no spinal tenderness  Lungs:     Bibasilar crackles, I cant hear any wheezes at this time.  Chest wall:    No tenderness to palpitation  Heart:    Regular rate and rhythm without murmurs, gallops, rubs  Abdomen:     Soft, non-tender, nondistended, normal bowel sounds, no organomegaly  Genitalia:    deferred  Rectal:    deferred  Extremities:   No clubbing, cyanosis or edema.  Pulses:   2+ and symmetric all extremities  Skin:   Skin color, texture, turgor normal, no rashes or lesions  Lymph nodes:   Cervical, supraclavicular, and axillary nodes normal  Neurologic:   CNII-XII intact. Normal strength, sensation and reflexes      throughout    Labs on Admission:  Basic Metabolic Panel:  Recent Labs Lab 06/01/15 1940  NA 145  K 4.3  CL 92*  CO2 36*  GLUCOSE 110*  BUN 20  CREATININE 0.86  CALCIUM 9.8   Liver Function Tests:  Recent Labs Lab 06/01/15 1940  AST 40  ALT 43  ALKPHOS 79  BILITOT 0.8  PROT 6.2*  ALBUMIN 3.3*   No results for input(s): LIPASE, AMYLASE in the last 168 hours. No results for input(s): AMMONIA in the last 168 hours. CBC:  Recent Labs Lab 06/01/15 1940  WBC 12.4*  NEUTROABS 7.6  HGB 17.2*  HCT >55.0*  MCV 95.2  PLT 229   Cardiac Enzymes:  Recent Labs Lab 06/01/15 1940  TROPONINI 0.07*    BNP (last 3 results) No results for input(s): PROBNP in the last 8760 hours. CBG: No results for input(s): GLUCAP in the last 168 hours.  Radiological Exams on Admission: Dg Chest Port 1 View  06/01/2015   CLINICAL DATA:  Hypoxia for 6 months. Bilateral lower extremity swelling today.  EXAM: PORTABLE CHEST - 1 VIEW  COMPARISON:  None.  FINDINGS: There is cardiomegaly and pulmonary vascular congestion. No consolidative process,  pneumothorax or effusion.  IMPRESSION: Cardiomegaly without acute disease.   Electronically Signed   By: Drusilla Kanner M.D.   On: 06/01/2015 20:16    EKG: Independently reviewed.  Assessment/Plan Active Problems:   Hypertensive urgency   Acute respiratory failure with hypoxia   Acute CHF   1. Acute respiratory failure with hypoxia - likely due to new diagnosis of CHF, vs first time we are unmasking chronic respiratory failure with hypoxia due to undiagnosed COPD from his 58 PY h/o smoking. 1. Adult wheeze protocol PRN 2. O2 via [place] 3. Will treat as acute CHF as well 4. BIPAP PRN 2. Acute CHF - 1. 2d echo ordered, needs echo to determine systolic vs diastolic 2. Treat as hypertensive urgency 1. Ordered Lisinopril 5mg  daily to start with (also for CHF), first dose now 2. Ordering PRN hydralazine as well 3. On nitro gtt 3. Lasix 40mg  BID IV, got first dose in ED 4. On CHF pathway 5. Daily BMP 6. Strict intake and output 7. Serial trops 8. Cardiology consulted    Code Status: Full Code  Family Communication: No family in room Disposition Plan: Admit to inpatient   Time spent: 70 min  GARDNER, JARED M. [facility] Pager [phone]  If 7AM-7PM, please contact the day team taking care of the patient [email or web address] Password [facility] 06/02/2015, 4:27 AM

## 2015-06-02 NOTE — Progress Notes (Signed)
RT note- Patient remains on nasal cannula, has been on the bipap two timetoday for short periods of time for napping. Bipap has been changed to cpap hs and prn. Everything has been explained and he understands.

## 2015-06-02 NOTE — ED Notes (Signed)
Pt placed on venturi-mask d/t continued low oxygen saturation - Dr. Read Drivers made aware of pt's current status and in to evalaute patient.

## 2015-06-02 NOTE — ED Notes (Signed)
Pt noted to have pulse ox of 75% on 3L/min [place] oxygen - pt found to be snoring and asleep - pt arouses to physical stimuli - O2 titrated up to 6L/min O2 by RT at bedside.

## 2015-06-02 NOTE — Progress Notes (Addendum)
*  PRELIMINARY RESULTS* Vascular Ultrasound Lower extremity venous duplex has been completed.  Preliminary findings: negative for DVT bilaterally. Baker's cyst noted on the right.  Farrel Demark, RDMS, RVT  06/02/2015, 11:41 AM

## 2015-06-02 NOTE — Progress Notes (Addendum)
Pt came to 2C14 by  [facility]. Nitro drip was increased upto 20 mcg/min. Systolic BP was upto 175 upon arrival. Pt alert and oriented. Denies any chest pain. Paged admitting doctor for further orders. Pt on [place] @ 3 L/min

## 2015-06-02 NOTE — ED Provider Notes (Signed)
1:40 AM Patient awaiting transport to [facility]. Patient is becoming somnolent with decreasing oxygen saturation. We will place him on BiPAP.    Paula Libra, MD 06/02/15 [phone]

## 2015-06-02 NOTE — ED Notes (Signed)
Patient removed from Bipap. Patient being transferred to [facility] hospital. Respiratory therapist notified

## 2015-06-02 NOTE — Progress Notes (Signed)
    BP improved on NTG IV.  Await echo.  Corky Crafts, MD

## 2015-06-03 ENCOUNTER — Inpatient Hospital Stay (HOSPITAL_COMMUNITY): Payer: Self-pay

## 2015-06-03 DIAGNOSIS — I509 Heart failure, unspecified: Secondary | ICD-10-CM

## 2015-06-03 DIAGNOSIS — R609 Edema, unspecified: Secondary | ICD-10-CM

## 2015-06-03 DIAGNOSIS — Z720 Tobacco use: Secondary | ICD-10-CM

## 2015-06-03 DIAGNOSIS — F17200 Nicotine dependence, unspecified, uncomplicated: Secondary | ICD-10-CM | POA: Diagnosis present

## 2015-06-03 DIAGNOSIS — I10 Essential (primary) hypertension: Secondary | ICD-10-CM

## 2015-06-03 LAB — BASIC METABOLIC PANEL
Anion gap: 6 (ref 5–15)
BUN: 14 mg/dL (ref 6–20)
CALCIUM: 8.6 mg/dL — AB (ref 8.9–10.3)
CHLORIDE: 97 mmol/L — AB (ref 101–111)
CO2: 40 mmol/L — AB (ref 22–32)
CREATININE: 0.88 mg/dL (ref 0.61–1.24)
Glucose, Bld: 104 mg/dL — ABNORMAL HIGH (ref 65–99)
POTASSIUM: 4.5 mmol/L (ref 3.5–5.1)
SODIUM: 143 mmol/L (ref 135–145)

## 2015-06-03 LAB — UIFE/LIGHT CHAINS/TP QN, 24-HR UR
% BETA, URINE: 14.1 %
ALBUMIN, U: 73.4 %
ALPHA 1 URINE: 1.5 %
Alpha 2, Urine: 6.8 %
FREE LT CHN EXCR RATE: 3.92 mg/L (ref 1.35–24.19)
Free Kappa/Lambda Ratio: 6.03 (ref 2.04–10.37)
Free Lambda Lt Chains,Ur: 0.65 mg/L (ref 0.24–6.66)
GAMMA GLOBULIN URINE: 4.2 %
TOTAL PROTEIN, URINE-UPE24: 21.2 mg/dL

## 2015-06-03 LAB — PROTEIN ELECTROPHORESIS, SERUM
A/G RATIO SPE: 1 (ref 0.7–1.7)
Albumin ELP: 2.6 g/dL — ABNORMAL LOW (ref 2.9–4.4)
Alpha-1-Globulin: 0.4 g/dL (ref 0.0–0.4)
Alpha-2-Globulin: 0.9 g/dL (ref 0.4–1.0)
Beta Globulin: 0.9 g/dL (ref 0.7–1.3)
GLOBULIN, TOTAL: 2.6 g/dL (ref 2.2–3.9)
Gamma Globulin: 0.3 g/dL — ABNORMAL LOW (ref 0.4–1.8)
TOTAL PROTEIN ELP: 5.2 g/dL — AB (ref 6.0–8.5)

## 2015-06-03 LAB — ANTINUCLEAR ANTIBODIES, IFA: ANTINUCLEAR ANTIBODIES, IFA: NEGATIVE

## 2015-06-03 MED ORDER — PERFLUTREN LIPID MICROSPHERE
1.0000 mL | INTRAVENOUS | Status: AC | PRN
Start: 2015-06-03 — End: 2015-06-03
  Administered 2015-06-03: 4 mL via INTRAVENOUS
  Filled 2015-06-03: qty 10

## 2015-06-03 MED ORDER — LISINOPRIL 10 MG PO TABS
10.0000 mg | ORAL_TABLET | Freq: Every day | ORAL | Status: DC
  Administered 2015-06-03 – 2015-06-04 (×2): 10 mg via ORAL
  Filled 2015-06-03 (×2): qty 1

## 2015-06-03 NOTE — Progress Notes (Signed)
   SUBJECTIVE:  No chest discomfort. Feels that the furosemide is helping him produce more urine.  OBJECTIVE:   Vitals:   Filed Vitals:   06/03/15 0400 06/03/15 0430 06/03/15 0500 06/03/15 0732  BP: 157/106 161/87  137/82  Pulse: 105 112  89  Temp: 98.3 F (36.8 C)   97.6 F (36.4 C)  TempSrc: Oral   Axillary  Resp: Height:      Weight:   296 lb 8.3 oz (134.5 kg)   SpO2: 97%   95%   I&O's:   Intake/Output Summary (Last 24 hours) at 06/03/15 0905 Last data filed at 06/03/15 0857  Gross per 24 hour  Intake    432 ml  Output   3700 ml  Net  -3268 ml   TELEMETRY: Reviewed telemetry pt in sinus tachycardia:     PHYSICAL EXAM General: Well developed, well nourished, in no acute distress Head:   Normal cephalic and atramatic  Lungs:   Clear bilaterally to auscultation. Heart:   HRRR S1 S2  No JVD.   Abdomen: abdomen soft and non-tender Msk:  Back normal,  Normal strength and tone for age. Extremities:   No edema.   Neuro: Alert and oriented. Psych:  Normal affect, responds appropriately Skin: No rash   LABS: Basic Metabolic Panel:  Recent Labs  16/10/96 1940 06/03/15 0239  NA 145 143  K 4.3 4.5  CL 92* 97*  CO2 36* 40*  GLUCOSE 110* 104*  BUN 20 14  CREATININE 0.86 0.88  CALCIUM 9.8 8.6*   Liver Function Tests:  Recent Labs  06/01/15 1940  AST 40  ALT 43  ALKPHOS 79  BILITOT 0.8  PROT 6.2*  ALBUMIN 3.3*   No results for input(s): LIPASE, AMYLASE in the last 72 hours. CBC:  Recent Labs  06/01/15 1940  WBC 12.4*  NEUTROABS 7.6  HGB 17.2*  HCT >55.0*  MCV 95.2  PLT 229   Cardiac Enzymes:  Recent Labs  06/02/15 0439 06/02/15 1150 06/02/15 1828  TROPONINI 0.05* 0.05* 0.05*   BNP: Invalid input(s): POCBNP D-Dimer: No results for input(s): DDIMER in the last 72 hours. Hemoglobin A1C: No results for input(s): HGBA1C in the last 72 hours. Fasting Lipid Panel: No results for input(s): CHOL, HDL, LDLCALC, TRIG, CHOLHDL,  LDLDIRECT in the last 72 hours. Thyroid Function Tests:  Recent Labs  06/02/15 0708  TSH 1.362   Anemia Panel:  Recent Labs  06/02/15 0708  FERRITIN 27  TIBC 484*  IRON 49   Coag Panel:   No results found for: INR, PROTIME  RADIOLOGY: Dg Chest Port 1 View  06/01/2015   CLINICAL DATA:  Hypoxia for 6 months. Bilateral lower extremity swelling today.  EXAM: PORTABLE CHEST - 1 VIEW  COMPARISON:  None.  FINDINGS: There is cardiomegaly and pulmonary vascular congestion. No consolidative process, pneumothorax or effusion.  IMPRESSION: Cardiomegaly without acute disease.   Electronically Signed   By: Drusilla Kanner M.D.   On: 06/01/2015 20:16      ASSESSMENT: Bob Rodriguez:  Awaiting echocardiogram results to see if his fluid retention is due to systolic or diastolic heart failure. Renal function is normal. Continue with diuresis.  Blood pressure is controlled.  Long-term, he will need aggressive lifestyle modifications including increased exercise, weight loss, smoking cessation and dietary changes.  Corky Crafts, MD  06/03/2015  9:05 AM

## 2015-06-03 NOTE — Progress Notes (Signed)
  Echocardiogram 2D Echocardiogram has been performed.  Nolon Rod 06/03/2015, 11:32 AM

## 2015-06-03 NOTE — Progress Notes (Signed)
[facility] PROGRESS NOTE  Bob Rodriguez ZOX:[id number] DOB: Sep 13, 1971 DOA: 06/01/2015  PCP: No primary care provider on file.  Brief HPI: 44 year old Caucasian male with a past medical history of hypertension, presented with complains of lower extremity edema. In the emergency department he was noted to be hypoxic in the 80s. He was noted to desaturate while sleeping. He was placed on BiPAP. He was hospitalized for further management.  Past medical history:  Past Medical History  Diagnosis Date  . Hypertension     Consultants: Cardiology  Procedures:  2-D echocardiogram is pending  Lower extremity venous Dopplers Negative for DVT. Baker cyst noted on the right.  Antibiotics: None  Subjective: Patient denies any complaints this morning. Leg swelling is improving. No chest pain or shortness of breath.   Objective: Vital Signs  Filed Vitals:   06/03/15 0030 06/03/15 0400 06/03/15 0430 06/03/15 0500  BP: 134/118 157/106 161/87   Pulse: 87 105 112   Temp:  98.3 F (36.8 C)    TempSrc:  Oral    Resp: 21 9 13    Height:      Weight:    134.5 kg (296 lb 8.3 oz)  SpO2:  97%      Intake/Output Summary (Last 24 hours) at 06/03/15 0729 Last data filed at 06/03/15 0600  Gross per 24 hour  Intake    552 ml  Output   3600 ml  Net  -3048 ml   Filed Weights   06/01/15 2002 06/02/15 0400 06/03/15 0500  Weight: 135.626 kg (299 lb) 133.8 kg (294 lb 15.6 oz) 134.5 kg (296 lb 8.3 oz)    General appearance: alert, cooperative, appears stated age, no distress and morbidly obese Resp: Diminished air entry at the bases without any crackles or wheezing. Cardio: regular rate and rhythm, S1, S2 normal, no murmur, click, rub or gallop GI: soft, non-tender; bowel sounds normal; no masses,  no organomegaly Extremities: 2+ edema bilateral lower extremities. Seems to be improving. Neurologic: No focal deficits  Lab Results:  Basic Metabolic Panel:  Recent Labs Lab  06/01/15 1940 06/03/15 0239  NA 145 143  K 4.3 4.5  CL 92* 97*  CO2 36* 40*  GLUCOSE 110* 104*  BUN 20 14  CREATININE 0.86 0.88  CALCIUM 9.8 8.6*   Liver Function Tests:  Recent Labs Lab 06/01/15 1940  AST 40  ALT 43  ALKPHOS 79  BILITOT 0.8  PROT 6.2*  ALBUMIN 3.3*   CBC:  Recent Labs Lab 06/01/15 1940  WBC 12.4*  NEUTROABS 7.6  HGB 17.2*  HCT >55.0*  MCV 95.2  PLT 229   Cardiac Enzymes:  Recent Labs Lab 06/01/15 1940 06/02/15 0439 06/02/15 1150 06/02/15 1828  TROPONINI 0.07* 0.05* 0.05* 0.05*   BNP (last 3 results)  Recent Labs  06/01/15 1940  BNP 315.6*     Recent Results (from the past 240 hour(s))  MRSA PCR Screening     Status: None   Collection Time: 06/02/15  3:52 AM  Result Value Ref Range Status   MRSA by PCR NEGATIVE NEGATIVE Final    Comment:        The GeneXpert MRSA Assay (FDA approved for NASAL specimens only), is one component of a comprehensive MRSA colonization surveillance program. It is not intended to diagnose MRSA infection nor to guide or monitor treatment for MRSA infections.       Studies/Results: Dg Chest Port 1 View  06/01/2015   CLINICAL DATA:  Hypoxia for 6  months. Bilateral lower extremity swelling today.  EXAM: PORTABLE CHEST - 1 VIEW  COMPARISON:  None.  FINDINGS: There is cardiomegaly and pulmonary vascular congestion. No consolidative process, pneumothorax or effusion.  IMPRESSION: Cardiomegaly without acute disease.   Electronically Signed   By: Drusilla Kanner M.D.   On: 06/01/2015 20:16    Medications:  Scheduled: . antiseptic oral rinse  7 mL Mouth Rinse BID  . furosemide  40 mg Intravenous BID  . heparin  5,000 Units Subcutaneous 3 times per day  . lisinopril  10 mg Oral Daily  . nicotine  21 mg Transdermal Daily  . sodium chloride  3 mL Intravenous Q12H   Continuous:   ZOX:WRUEAV chloride, acetaminophen, calcium carbonate, hydrALAZINE, morphine injection, ondansetron (ZOFRAN) IV, sodium  chloride  Assessment/Plan:  Active Problems:   Hypertensive urgency   Acute respiratory failure with hypoxia   Acute CHF    Acute respiratory failure with hypoxia Thought to be secondary to congestive heart failure. X-ray did not show any florid pulmonary edema. There likely is a component of COPD and obesity hypoventilation. Patient remained stable. Requiring C Pap overnight. He will eventually need a sleep study as an outpatient.  Presumed acute congestive heart failure with lower extremity edema Unclear if this is diastolic or systolic. Echocardiogram is pending. There could also be a component of pulmonary hypertension. Continue diuresis. He is diuresing well. Monitor ins and outs. Daily weights. TSH is normal.   Hypertensive urgency Blood pressure is improved. Can discontinue intravenous nitroglycerin. Increase dose of lisinopril.   Mildly elevated troponin Likely secondary to demand ischemia. Further management per cardiology.  Polycythemia Likely secondary due to smoking.  Tobacco abuse Nicotine patch  DVT Prophylaxis: Subcutaneous heparin    Code Status: Full code  Family Communication: Discussed with the patient and his daughter.  Disposition Plan: Possible transfer to the floor later today. Await echocardiogram  Follow-up Appointment?: Per daughter, He does not have a PCP.   LOS: 2 days   [facility]  [facility] Pager [phone] 06/03/2015, 7:29 AM  If 7PM-7AM, please contact night-coverage at [email or web address], password [facility]

## 2015-06-03 NOTE — Progress Notes (Signed)
Placed patient on CPAP for the night. Patient on 10 cmH20 and tolerating well. RT will continue to monitor.

## 2015-06-04 ENCOUNTER — Inpatient Hospital Stay (HOSPITAL_COMMUNITY): Payer: Self-pay

## 2015-06-04 DIAGNOSIS — I5041 Acute combined systolic (congestive) and diastolic (congestive) heart failure: Secondary | ICD-10-CM | POA: Diagnosis present

## 2015-06-04 LAB — CBC
HCT: 57 % — ABNORMAL HIGH (ref 39.0–52.0)
Hemoglobin: 17.8 g/dL — ABNORMAL HIGH (ref 13.0–17.0)
MCH: 30.5 pg (ref 26.0–34.0)
MCHC: 31.2 g/dL (ref 30.0–36.0)
MCV: 97.8 fL (ref 78.0–100.0)
Platelets: 220 10*3/uL (ref 150–400)
RBC: 5.83 MIL/uL — ABNORMAL HIGH (ref 4.22–5.81)
RDW: 15.2 % (ref 11.5–15.5)
WBC: 11.6 10*3/uL — AB (ref 4.0–10.5)

## 2015-06-04 LAB — BLOOD GAS, ARTERIAL
Acid-Base Excess: 12.3 mmol/L — ABNORMAL HIGH (ref 0.0–2.0)
Bicarbonate: 38.3 mEq/L — ABNORMAL HIGH (ref 20.0–24.0)
DRAWN BY: 276051
Delivery systems: POSITIVE
EXPIRATORY PAP: 8
O2 Content: 5 L/min
O2 Saturation: 82.8 %
PATIENT TEMPERATURE: 98.6
PCO2 ART: 70.5 mmHg — AB (ref 35.0–45.0)
PO2 ART: 51.5 mmHg — AB (ref 80.0–100.0)
TCO2: 40.4 mmol/L (ref 0–100)
pH, Arterial: 7.354 (ref 7.350–7.450)

## 2015-06-04 LAB — BASIC METABOLIC PANEL
ANION GAP: 7 (ref 5–15)
BUN: 11 mg/dL (ref 6–20)
CHLORIDE: 95 mmol/L — AB (ref 101–111)
CO2: 38 mmol/L — ABNORMAL HIGH (ref 22–32)
Calcium: 8.6 mg/dL — ABNORMAL LOW (ref 8.9–10.3)
Creatinine, Ser: 0.9 mg/dL (ref 0.61–1.24)
GFR calc Af Amer: >60 mL/min (ref >60)
GFR calc non Af Amer: >60 mL/min (ref >60)
Glucose, Bld: 89 mg/dL (ref 65–99)
POTASSIUM: 4.5 mmol/L (ref 3.5–5.1)
Sodium: 140 mmol/L (ref 135–145)

## 2015-06-04 MED ORDER — LISINOPRIL 20 MG PO TABS
20.0000 mg | ORAL_TABLET | Freq: Every day | ORAL | Status: DC
  Administered 2015-06-05 – 2015-06-11 (×7): 20 mg via ORAL
  Filled 2015-06-04 (×7): qty 1

## 2015-06-04 MED ORDER — ASPIRIN 325 MG PO TABS
325.0000 mg | ORAL_TABLET | Freq: Every day | ORAL | Status: DC
  Administered 2015-06-04 – 2015-06-11 (×8): 325 mg via ORAL
  Filled 2015-06-04 (×8): qty 1

## 2015-06-04 NOTE — Progress Notes (Addendum)
SUBJECTIVE:  Patient feels better. He feels that his swelling is decreased.  He typically sleeps upright due to his sleep apnea.  OBJECTIVE:   Vitals:   Filed Vitals:   06/04/15 0756 06/04/15 0800 06/04/15 0801 06/04/15 0900  BP:  124/67  154/71  Pulse: 71 185  94  Temp:      TempSrc:      Resp: Height:      Weight:      SpO2: 100%  100%    I&O's:   Intake/Output Summary (Last 24 hours) at 06/04/15 1030 Last data filed at 06/04/15 0948  Gross per 24 hour  Intake    600 ml  Output   2675 ml  Net  -2075 ml   TELEMETRY: Reviewed telemetry pt in normal sinus rhythm:     PHYSICAL EXAM General: Well developed, well nourished, in no acute distress Head:   Normal cephalic and atramatic  Lungs:   Clear bilaterally to auscultation. Heart:   HRRR S1 S2  No JVD.   Abdomen: abdomen soft and non-tender Msk:  Back normal,  Normal strength and tone for age. Extremities:   1+ pitting ankle edema.   Neuro: Alert and oriented. Psych:  Normal affect, responds appropriately Skin: No rash   LABS: Basic Metabolic Panel:  Recent Labs  16/10/96 0239 06/04/15 0227  NA 143 140  K 4.5 4.5  CL 97* 95*  CO2 40* 38*  GLUCOSE 104* 89  BUN 14 11  CREATININE 0.88 0.90  CALCIUM 8.6* 8.6*   Liver Function Tests:  Recent Labs  06/01/15 1940  AST 40  ALT 43  ALKPHOS 79  BILITOT 0.8  PROT 6.2*  ALBUMIN 3.3*   No results for input(s): LIPASE, AMYLASE in the last 72 hours. CBC:  Recent Labs  06/01/15 1940 06/04/15 0227  WBC 12.4* 11.6*  NEUTROABS 7.6  --   HGB 17.2* 17.8*  HCT >55.0* 57.0*  MCV 95.2 97.8  PLT 229 220   Cardiac Enzymes:  Recent Labs  06/02/15 0439 06/02/15 1150 06/02/15 1828  TROPONINI 0.05* 0.05* 0.05*   BNP: Invalid input(s): POCBNP D-Dimer: No results for input(s): DDIMER in the last 72 hours. Hemoglobin A1C: No results for input(s): HGBA1C in the last 72 hours. Fasting Lipid Panel: No results for input(s): CHOL, HDL,  LDLCALC, TRIG, CHOLHDL, LDLDIRECT in the last 72 hours. Thyroid Function Tests:  Recent Labs  06/02/15 0708  TSH 1.362   Anemia Panel:  Recent Labs  06/02/15 0708  FERRITIN 27  TIBC 484*  IRON 49   Coag Panel:   No results found for: INR, PROTIME  RADIOLOGY: Dg Chest Port 1 View  06/01/2015   CLINICAL DATA:  Hypoxia for 6 months. Bilateral lower extremity swelling today.  EXAM: PORTABLE CHEST - 1 VIEW  COMPARISON:  None.  FINDINGS: There is cardiomegaly and pulmonary vascular congestion. No consolidative process, pneumothorax or effusion.  IMPRESSION: Cardiomegaly without acute disease.   Electronically Signed   By: Drusilla Kanner M.D.   On: 06/01/2015 20:16      ASSESSMENT: Bob Rodriguez:    Combined acute systolic and diastolic heart failure. There is also a component of right heart failure based on the ultrasound. This is likely due to chronic lung disease from COPD and sleep apnea causing hypoxia. His elevated hemoglobin is likely due to the chronic hypoxemia. He is desaturating when supplemental oxygen is removed. He likely has pulmonary hypertension. He may need oxygen upon discharge.  Echocardiogram reveals mildly decreased left ventricular systolic function although this is a technically difficult study. No focal regional wall motion abnormalities were identified.  I suspect this is from uncontrolled hypertension. Once he is stabilized from a blood pressure and respiratory standpoint, he would need right and left heart catheterization. This could be done as an outpatient. He is not having any angina. He only had minimally elevated troponins which are likely due to heart failure.  We had a long talk about lifestyle modifications. He has not been compliant with medications in the past. He has not been compliant with medical follow-up. I stressed the importance of following his blood pressure regularly. Either he will obtain a cuff for home or, he will go to a local drug store.  Increase lisinopril to 20 mg by mouth daily.  He will need to take a daily diuretics as well upon discharge.  He will need to stop smoking. He would like nicotine patches when he goes home.  Corky Crafts, MD  06/04/2015  10:30 AM

## 2015-06-04 NOTE — Progress Notes (Signed)
Bob Rodriguez is a 44 y.o. male patient who transferred  from [facility] 14 awake, alert  & orientated  X 3, Full Code, VSS - Blood pressure 132/90, pulse 100, temperature 99.2 F (37.3 C), temperature source Oral, resp. rate 18, height  (1.702 m), weight 133.6 kg (294 lb 8.6 oz), SpO2 94 %., O2   @ 4 L nasal cannular, no c/o shortness of breath, no c/o chest pain, no distress noted. Tele # 23 placed and pt is currently running:normal sinus rhythm.   IV site WDL: forearm right, condition patent and no redness with a transparent dsg that's clean dry and intact.  Allergies:   Allergies  Allergen Reactions  . Codeine Other (See Comments)    unknown     Past Medical History  Diagnosis Date  . Hypertension     Pt orientation to unit, room and routine. SR up x 2, fall risk assessment complete with Patient and family verbalizing understanding of risks associated with falls. Pt verbalizes an understanding of how to use the call bell and to call for help before getting out of bed.  Skin, clean-dry- intact without evidence of bruising, or skin tears.   No evidence of skin break down noted on exam. no rashes    Will cont to monitor and assist as needed.  Bob, [name], RN 06/04/2015 5:26 PM

## 2015-06-04 NOTE — Progress Notes (Signed)
Placed Pt on CPAP per Pt & RN request so Pt could nap. Found Pt sitting in upright position in bed napping. On 4L [place], Sp02 = 78 - 80, with HR = 104.  On Autotitrate CPAP w 4L 02, Sp02= 94, HR = 100 Pt currently sleeping, comfortable on CPAP.

## 2015-06-04 NOTE — Care Management Note (Signed)
Case Management Note  Patient Details  Name: Bob Rodriguez MRN: [id number] Date of Birth: 12-Mar-1971  Subjective/Objective:  Appointment arranged @ [facility] and [facility] for Thursday, August 4th @ 10:30 a.m.  Pt will qualify for 34-day supply of medications on d/c through [facility] program.  Ref[facility] form for [facility] through [facility] on shadow chart for MD signature.                              Expected Discharge Plan:  Home/Self Care  In-House Referral:   Financial Counselor  Discharge planning Services  CM Consult, Follow-up appt scheduled, [facility], [facility]  Status of Service:  In process, will continue to follow  Magdalene River, RN 06/04/2015, 2:16 PM

## 2015-06-04 NOTE — Progress Notes (Signed)
ABG result of ph 7.35, CO2 70.5, O2 51.5, bicarb 38.3,  text paged to Dr. Rito Ehrlich.  Will continue to monitor.  Forbes Cellar, RN

## 2015-06-04 NOTE — Progress Notes (Signed)
Rechecked Pt Sp02 = 73-77 while on CPAP w 5 L 02. Notified RN. Suggest that Pt needs stepdown for full time BIpap

## 2015-06-04 NOTE — Progress Notes (Addendum)
Called by Rn as patient was lethargic and with low O2 sats (70's).  He was apparently doing well after transfer from stepdown but then fell asleep without cpap. Which led to low sats and lethargy.  He was placed on cpap about 30 mins ago. He is more responsive now. Lungs are clear. Sats improved to 87%. Given his Pm dose of lasix.  Will get CXR and ABG.  He will need closer monitoring due to this setback. Will transfer back to stepdown.  Discussed with pulmonology. Await ABG. May need bipap.  Bob Rodriguez 5:27 PM   ABG reviewed. PCo2 is elevated but ph is normal. He is chronically compensated as bicarb on bmet is also high. O2 sats noted to be low on ABg but improved at bedside. Will leave him on CPAP for now. CXR pending. Will benefit from pulmonary input in AM. Despite improvement he will still benefit from closer monitoring tonight which can only be provided in a stepdown setting.  Bob Rodriguez 5:50 PM

## 2015-06-04 NOTE — Progress Notes (Signed)
PT Cancellation Note  Patient Details Name: Bob Rodriguez MRN: [id number] DOB: 1971/06/16   Cancelled Treatment:    Reason Eval/Treat Not Completed: Patient not medically ready.  Respiratory therapist in room and note that when she arrived, pt on 4LO2 and sats were in low 80's.  She donned CPAP and note pt extremely fatigued and does not seem to have voided much fluid per Lasix.  She recommend PT check back tomorrow afternoon, however may need mask or non-rebreather for gait.     Vista Deck 06/04/2015, 4:57 PM

## 2015-06-04 NOTE — Clinical Documentation Improvement (Signed)
Acute CHF, unknown if systolic or diastolic currently documented.  Echo now completed 06/03/15 and results available. Please identify the type of acute chf present / treated this admission (diastolic, systolic, combined diastolic and systolic) and document in your future progress notes and discharge summary.    Thank you, Doy Mince, RN [phone] Clinical Documentation Specialist

## 2015-06-04 NOTE — Progress Notes (Signed)
Report call to Annabelle Harman, RN on 2C, will transfer pt. To 2C17 via bed.  Forbes Cellar, RN

## 2015-06-04 NOTE — Progress Notes (Signed)
[facility] PROGRESS NOTE  Bob Rodriguez ZOX:[id number] DOB: August 24, 1971 DOA: 06/01/2015  PCP: No primary care provider on file.  Brief HPI: 44 year old Caucasian male with a past medical history of hypertension, presented with complains of lower extremity edema. In the emergency department he was noted to be hypoxic in the 80s. He was noted to desaturate while sleeping. He was placed on BiPAP. He was hospitalized for further management. He was changed over to CPAP. His swelling improved with diuretics.  Past medical history:  Past Medical History  Diagnosis Date  . Hypertension     Consultants: Cardiology  Procedures:  2-D echocardiogram Study Conclusions - Left ventricle: The cavity size was mildly dilated. Wallthickness was increased in a pattern of mild LVH. Systolicfunction was mildly to moderately reduced. The estimated ejectionfraction was in the range of 40% to 45%. Images were inadequatefor LV wall motion assessment. Features are consistent with apseudonormal left ventricular filling pattern, with concomitantabnormal relaxation and increased filling pressure (grade 2diastolic dysfunction). - Aortic valve: There was no stenosis. - Mitral valve: There was no significant regurgitation. - Left atrium: The atrium was mildly dilated. - Right ventricle: The cavity size was moderately dilated. Systolicfunction was mildly reduced. - Right atrium: The atrium was mildly dilated. - Tricuspid valve: Poorly visualized. - Pulmonary arteries: No complete TR doppler jet so unable toestimate PA systolic pressure. Impressions: - Technically difficult study, images very poor even with Definity.Mildly dilated LV with mild LV hypertrophy. EF probably around40-45%, difficult to comment on individual wall segments.Moderately dilated RV with mildly decreased systolic function.  Lower extremity venous Dopplers Negative for DVT. Baker cyst noted on the  right.  Antibiotics: None  Subjective: Patient feels well this morning. Swelling in the legs is improving. Denies any chest pain or shortness of breath.   Objective: Vital Signs  Filed Vitals:   06/04/15 0800 06/04/15 0801 06/04/15 0900 06/04/15 1149  BP: 124/67  154/71 124/92  Pulse: 185  94 97  Temp:    99.3 F (37.4 C)  TempSrc:    Oral  Resp: 16  21 18   Height:      Weight:      SpO2:  100%  93%    Intake/Output Summary (Last 24 hours) at 06/04/15 1233 Last data filed at 06/04/15 1121  Gross per 24 hour  Intake    840 ml  Output   3000 ml  Net  -2160 ml   Filed Weights   06/02/15 0400 06/03/15 0500 06/04/15 0459  Weight: 133.8 kg (294 lb 15.6 oz) 134.5 kg (296 lb 8.3 oz) 133.6 kg (294 lb 8.6 oz)    General appearance: alert, cooperative, appears stated age, no distress and morbidly obese Resp: Diminished air entry at the bases without any crackles or wheezing. Cardio: regular rate and rhythm, S1, S2 normal, no murmur, click, rub or gallop GI: soft, non-tender; bowel sounds normal; no masses,  no organomegaly Extremities: 2+ edema bilateral lower extremities. improving. Neurologic: No focal deficits  Lab Results:  Basic Metabolic Panel:  Recent Labs Lab 06/01/15 1940 06/03/15 0239 06/04/15 0227  NA 145 143 140  K 4.3 4.5 4.5  CL 92* 97* 95*  CO2 36* 40* 38*  GLUCOSE 110* 104* 89  BUN 20 14 11   CREATININE 0.86 0.88 0.90  CALCIUM 9.8 8.6* 8.6*   Liver Function Tests:  Recent Labs Lab 06/01/15 1940  AST 40  ALT 43  ALKPHOS 79  BILITOT 0.8  PROT 6.2*  ALBUMIN 3.3*  CBC:  Recent Labs Lab 06/01/15 1940 06/04/15 0227  WBC 12.4* 11.6*  NEUTROABS 7.6  --   HGB 17.2* 17.8*  HCT >55.0* 57.0*  MCV 95.2 97.8  PLT 229 220   Cardiac Enzymes:  Recent Labs Lab 06/01/15 1940 06/02/15 0439 06/02/15 1150 06/02/15 1828  TROPONINI 0.07* 0.05* 0.05* 0.05*   BNP (last 3 results)  Recent Labs  06/01/15 1940  BNP 315.6*     Recent  Results (from the past 240 hour(s))  MRSA PCR Screening     Status: None   Collection Time: 06/02/15  3:52 AM  Result Value Ref Range Status   MRSA by PCR NEGATIVE NEGATIVE Final    Comment:        The GeneXpert MRSA Assay (FDA approved for NASAL specimens only), is one component of a comprehensive MRSA colonization surveillance program. It is not intended to diagnose MRSA infection nor to guide or monitor treatment for MRSA infections.       Studies/Results: No results found.  Medications:  Scheduled: . antiseptic oral rinse  7 mL Mouth Rinse BID  . furosemide  40 mg Intravenous BID  . heparin  5,000 Units Subcutaneous 3 times per day  . [START ON 06/05/2015] lisinopril  20 mg Oral Daily  . nicotine  21 mg Transdermal Daily  . sodium chloride  3 mL Intravenous Q12H   Continuous:   ZOX:WRUEAV chloride, acetaminophen, calcium carbonate, hydrALAZINE, morphine injection, ondansetron (ZOFRAN) IV, sodium chloride  Assessment/Plan:  Principal Problem:   Acute combined systolic and diastolic congestive heart failure Active Problems:   Hypertensive urgency   Acute respiratory failure with hypoxia   Acute CHF   Swelling   Tobacco use disorder   Hypertension, uncontrolled    Acute respiratory failure with hypoxia Secondary to congestive heart failure. X-ray did not show any florid pulmonary edema. There likely is a component of COPD and obesity hypoventilation as well. Patient remains stable. Requiring C Pap overnight. He will eventually need a sleep study as an outpatient. Will likely need home oxygen.  Acute combined systolic and diastolic congestive heart failure with lower extremity edema Echocardiogram as above. Being followed by cardiology. He is on diuretics. Seems to be diuresing well. Weight is coming down. Monitor ins and outs. Daily weights. TSH is normal.   Hypertensive urgency Blood pressure is improved but still not optimal. Increase dose of lisinopril.  Nitroglycerin infusion was discontinued yesterday.  Mildly elevated troponin Likely secondary to demand ischemia. No plans for inpatient workup. Cardiac catheterization to be pursued as an outpatient. Aspirin.  Polycythemia Likely secondary due to smoking.  Tobacco abuse Nicotine patch  DVT Prophylaxis: Subcutaneous heparin    Code Status: Full code  Family Communication: Discussed with the patient  Disposition Plan: transfer to telemetry today. Start mobilizing.  Follow-up Appointment?: Per daughter, He does not have a PCP. [facility]   LOS: 3 days   [facility]  [facility] Pager [phone] 06/04/2015, 12:33 PM  If 7PM-7AM, please contact night-coverage at [email or web address], password [facility]

## 2015-06-04 NOTE — Significant Event (Signed)
Rapid Response Event Note  Overview: Time Called: 1656 Arrival Time: 1700 Event Type: Respiratory  Initial Focused Assessment:  Called by primary RN for patient with lethargy and sats 78%, Patient was placed on CPAP machine by RT, sats improved to 94% briefly then fell back to 82% with 5 lpm bleed in oxygen.  On my arrival to patient room RN and RT at bedside.  Patient arouses with verbal stimuli but drifts back to sleep quickly.  Skin warm and dry   Interventions:  MD paged, at bedside.  Orders received for ABG, PCXR and transfer to SDU.  Has been on CPAP for about 30 minutes and sats 87%.  Vitals 142/80, HR 96, sats 92%.  RR 14, patient more awake now.  ABG results given to MD 7.35, co2 70.5, o2 51.5 sats 82.8 %   Event Summary:  RN to call if assistance needed   at      at          [facility], [place]es Amos

## 2015-06-05 DIAGNOSIS — J9601 Acute respiratory failure with hypoxia: Secondary | ICD-10-CM | POA: Diagnosis present

## 2015-06-05 DIAGNOSIS — J9602 Acute respiratory failure with hypercapnia: Secondary | ICD-10-CM

## 2015-06-05 DIAGNOSIS — G4733 Obstructive sleep apnea (adult) (pediatric): Secondary | ICD-10-CM | POA: Diagnosis present

## 2015-06-05 DIAGNOSIS — J441 Chronic obstructive pulmonary disease with (acute) exacerbation: Secondary | ICD-10-CM | POA: Diagnosis present

## 2015-06-05 LAB — BASIC METABOLIC PANEL
Anion gap: 5 (ref 5–15)
BUN: 12 mg/dL (ref 6–20)
CALCIUM: 8.5 mg/dL — AB (ref 8.9–10.3)
CHLORIDE: 96 mmol/L — AB (ref 101–111)
CO2: 38 mmol/L — AB (ref 22–32)
Creatinine, Ser: 0.8 mg/dL (ref 0.61–1.24)
GFR calc Af Amer: >60 mL/min (ref >60)
GFR calc non Af Amer: >60 mL/min (ref >60)
Glucose, Bld: 82 mg/dL (ref 65–99)
Potassium: 4.2 mmol/L (ref 3.5–5.1)
Sodium: 139 mmol/L (ref 135–145)

## 2015-06-05 MED ORDER — AZITHROMYCIN 500 MG PO TABS
500.0000 mg | ORAL_TABLET | Freq: Every day | ORAL | Status: AC
  Administered 2015-06-05: 500 mg via ORAL
  Filled 2015-06-05: qty 1

## 2015-06-05 MED ORDER — METHYLPREDNISOLONE SODIUM SUCC 125 MG IJ SOLR
60.0000 mg | Freq: Four times a day (QID) | INTRAMUSCULAR | Status: DC
  Administered 2015-06-05 – 2015-06-07 (×9): 60 mg via INTRAVENOUS
  Filled 2015-06-05: qty 0.96
  Filled 2015-06-05: qty 2
  Filled 2015-06-05 (×2): qty 0.96
  Filled 2015-06-05 (×2): qty 2
  Filled 2015-06-05 (×3): qty 0.96
  Filled 2015-06-05: qty 2
  Filled 2015-06-05 (×2): qty 0.96
  Filled 2015-06-05: qty 2

## 2015-06-05 MED ORDER — HYDRALAZINE HCL 20 MG/ML IJ SOLN
10.0000 mg | Freq: Once | INTRAMUSCULAR | Status: AC
  Administered 2015-06-05: 10 mg via INTRAVENOUS
  Filled 2015-06-05: qty 1

## 2015-06-05 MED ORDER — AZITHROMYCIN 250 MG PO TABS
250.0000 mg | ORAL_TABLET | Freq: Every day | ORAL | Status: AC
  Administered 2015-06-06 – 2015-06-09 (×4): 250 mg via ORAL
  Filled 2015-06-05 (×4): qty 1

## 2015-06-05 MED ORDER — IPRATROPIUM-ALBUTEROL 0.5-2.5 (3) MG/3ML IN SOLN
3.0000 mL | Freq: Four times a day (QID) | RESPIRATORY_TRACT | Status: DC
  Administered 2015-06-05 – 2015-06-08 (×12): 3 mL via RESPIRATORY_TRACT
  Filled 2015-06-05 (×11): qty 3

## 2015-06-05 MED ORDER — TRAMADOL HCL 50 MG PO TABS
50.0000 mg | ORAL_TABLET | Freq: Four times a day (QID) | ORAL | Status: DC | PRN
  Administered 2015-06-05 – 2015-06-11 (×9): 50 mg via ORAL
  Filled 2015-06-05 (×9): qty 1

## 2015-06-05 NOTE — Progress Notes (Signed)
[facility] PROGRESS NOTE  Bob Rodriguez ZOX:[id number] DOB: 11-20-1970 DOA: 06/01/2015  PCP: Setup at [facility] on Aug 4.  Brief HPI: 44 year old Caucasian male with a past medical history of hypertension, presented with complains of lower extremity edema. In the emergency department he was noted to be hypoxic in the 80s. He was noted to desaturate while sleeping. He was placed on BiPAP. He was hospitalized for further management. He was changed over to CPAP. His swelling is improving with diuretics.  Past medical history:  Past Medical History  Diagnosis Date  . Hypertension     Consultants: Cardiology, pulmonology  Procedures:  2-D echocardiogram Study Conclusions - Left ventricle: The cavity size was mildly dilated. Wallthickness was increased in a pattern of mild LVH. Systolicfunction was mildly to moderately reduced. The estimated ejectionfraction was in the range of 40% to 45%. Images were inadequatefor LV wall motion assessment. Features are consistent with apseudonormal left ventricular filling pattern, with concomitantabnormal relaxation and increased filling pressure (grade 2diastolic dysfunction). - Aortic valve: There was no stenosis. - Mitral valve: There was no significant regurgitation. - Left atrium: The atrium was mildly dilated. - Right ventricle: The cavity size was moderately dilated. Systolicfunction was mildly reduced. - Right atrium: The atrium was mildly dilated. - Tricuspid valve: Poorly visualized. - Pulmonary arteries: No complete TR doppler jet so unable toestimate PA systolic pressure. Impressions: - Technically difficult study, images very poor even with Definity.Mildly dilated LV with mild LV hypertrophy. EF probably around40-45%, difficult to comment on individual wall segments.Moderately dilated RV with mildly decreased systolic function.  Lower extremity venous Dopplers Negative for DVT. Baker cyst noted on the  right.  Antibiotics: None  Subjective: Patient feels well this morning. Did not have any further issues with his breathing since yesterday evening. Denies any chest pain.   Objective: Vital Signs  Filed Vitals:   06/05/15 0500 06/05/15 0540 06/05/15 0600 06/05/15 0605  BP:  143/86 163/113 170/111  Pulse:  99 99 97  Temp:  98.1 F (36.7 C)    TempSrc:  Axillary    Resp:  Height:      Weight: 129.9 kg (286 lb 6 oz)     SpO2:  94% 95% 94%    Intake/Output Summary (Last 24 hours) at 06/05/15 0731 Last data filed at 06/04/15 2348  Gross per 24 hour  Intake    480 ml  Output   2650 ml  Net  -2170 ml   Filed Weights   06/03/15 0500 06/04/15 0459 06/05/15 0500  Weight: 134.5 kg (296 lb 8.3 oz) 133.6 kg (294 lb 8.6 oz) 129.9 kg (286 lb 6 oz)    General appearance: alert, cooperative, appears stated age, no distress and morbidly obese Resp: Diminished air entry at the bases without any crackles or wheezing. Cardio: regular rate and rhythm, S1, S2 normal, no murmur, click, rub or gallop GI: soft, non-tender; bowel sounds normal; no masses,  no organomegaly Extremities: 2+ edema bilateral lower extremities. improving. Neurologic: No focal deficits  Lab Results:  Basic Metabolic Panel:  Recent Labs Lab 06/01/15 1940 06/03/15 0239 06/04/15 0227 06/05/15 0208  NA 145 143 140 139  K 4.3 4.5 4.5 4.2  CL 92* 97* 95* 96*  CO2 36* 40* 38* 38*  GLUCOSE 110* 104* 89 82  BUN CREATININE 0.86 0.88 0.90 0.80  CALCIUM 9.8 8.6* 8.6* 8.5*   Liver Function Tests:  Recent Labs Lab 06/01/15 1940  AST 40  ALT 43  ALKPHOS 79  BILITOT 0.8  PROT 6.2*  ALBUMIN 3.3*   CBC:  Recent Labs Lab 06/01/15 1940 06/04/15 0227  WBC 12.4* 11.6*  NEUTROABS 7.6  --   HGB 17.2* 17.8*  HCT >55.0* 57.0*  MCV 95.2 97.8  PLT 229 220   Cardiac Enzymes:  Recent Labs Lab 06/01/15 1940 06/02/15 0439 06/02/15 1150 06/02/15 1828  TROPONINI 0.07* 0.05* 0.05*  0.05*   BNP (last 3 results)  Recent Labs  06/01/15 1940  BNP 315.6*     Recent Results (from the past 240 hour(s))  MRSA PCR Screening     Status: None   Collection Time: 06/02/15  3:52 AM  Result Value Ref Range Status   MRSA by PCR NEGATIVE NEGATIVE Final    Comment:        The GeneXpert MRSA Assay (FDA approved for NASAL specimens only), is one component of a comprehensive MRSA colonization surveillance program. It is not intended to diagnose MRSA infection nor to guide or monitor treatment for MRSA infections.       Studies/Results: Dg Chest Port 1 View  06/04/2015   CLINICAL DATA:  Lethargic.  Low O2 sats.  Hypertension, smoker.  EXAM: PORTABLE CHEST - 1 VIEW  COMPARISON:  06/01/2015  FINDINGS: Cardiomegaly. Mild peribronchial thickening and interstitial prominence is stable since prior study, likely chronic bronchitis. No acute airspace opacities, effusions or edema. No acute bony abnormality.  IMPRESSION: Cardiomegaly.  Stable peribronchial thickening and interstitial prominence, likely chronic bronchitis.   Electronically Signed   By: Charlett Nose M.D.   On: 06/04/2015 18:05    Medications:  Scheduled: . antiseptic oral rinse  7 mL Mouth Rinse BID  . aspirin  325 mg Oral Daily  . furosemide  40 mg Intravenous BID  . heparin  5,000 Units Subcutaneous 3 times per day  . lisinopril  20 mg Oral Daily  . nicotine  21 mg Transdermal Daily  . sodium chloride  3 mL Intravenous Q12H   Continuous:   EAV:[id number] chloride, acetaminophen, calcium carbonate, hydrALAZINE, morphine injection, ondansetron (ZOFRAN) IV, sodium chloride  Assessment/Plan:  Principal Problem:   Acute combined systolic and diastolic congestive heart failure Active Problems:   Hypertensive urgency   Acute respiratory failure with hypoxia   Acute CHF   Swelling   Tobacco use disorder   Hypertension, uncontrolled    Acute respiratory failure with hypoxia Multifactorial. Patient noted to  have combined systolic and diastolic congestive heart failure. He also appears to have an element of obstructive sleep apnea and possibly even obesity hypoventilation syndrome. Patient was in stepdown unit and was transferred to the floor yesterday morning. Yesterday afternoon, patient fell asleep without his C Pap, which resulted in lethargy and hypoxia. He had to be transferred back to the stepdown unit. He seems to be doing better this morning. Pulmonology has been consulted for assistance with further management. He will likely need to be on either CPAP or BiPAP at home. ABG from yesterday has been reviewed. He will likely also need home oxygen.   Likely OHS/OSA Lifestyle changes discussed with the patient. Compliance with medication emphasized. Await pulmonology input.  Acute combined systolic and diastolic congestive heart failure with lower extremity edema Echocardiogram as above. Cardiology is following. Continue diuretics. Monitor ins and outs. Daily weights. TSH is normal.    Hypertensive urgency Blood pressure is improved. Continue current dose of lisinopril. Patient was on intravenous nitroglycerin initially, which was discontinued.   Mildly  elevated troponin Likely secondary to demand ischemia. No plans for inpatient workup. Cardiac catheterization to be pursued as an outpatient. Aspirin.  Polycythemia Likely secondary due to smoking.  Tobacco abuse Nicotine patch  DVT Prophylaxis: Subcutaneous heparin    Code Status: Full code  Family Communication: Discussed with the patient  Disposition Plan: Will remain in step down today. Start mobilizing. Await pulmonology input.  Follow-up Appointment?: [facility]   LOS: 4 days   [facility]  [facility] Pager [phone] 06/05/2015, 7:31 AM  If 7PM-7AM, please contact night-coverage at [email or web address], password [facility]

## 2015-06-05 NOTE — Progress Notes (Signed)
   06/05/15 0900  Clinical Encounter Type  Visited With Patient and family together  Visit Type Initial  Referral From Family  Advance Directives (For Healthcare)  Does patient have an advance directive? No  Would patient like information on creating an advanced directive? Yes - Educational materials given   Chaplain was paged to patient's room at 9:09 AM. Patient's daughter was visiting with patient. Patient and patient's daughter had questions regarding filling out a healthcare power of attorney form for the patient. Chaplain answered the family's questions and let them know that it would be more expedient to have the document completed first thing Monday morning. Patient is still unclear on whether or not he thinks a healthcare power of attorney form is necessary for him but if he decides to fill it out please call the spiritual care department office on Monday and a chaplain will facilitate the document's completion.  Cranston Neighbor, Chaplain  9:24 AM

## 2015-06-05 NOTE — Consult Note (Signed)
Name: Bob Rodriguez MRN: [id number] DOB: June 11, 1971    ADMISSION DATE:  06/01/2015 CONSULTATION DATE:  06/05/2015  REFERRING MD :  TR[facility]  CHIEF COMPLAINT:  Hypercarbia, dyspnea, prod cough  BRIEF PATIENT DESCRIPTION:  44 y.o.M 2PPD smoker with AECOPD newly dx , acute on chronic hypoxia/hypercarbia resp failure, Acute systolic HF new dx EF 45% HTN, does not see MDs regularly.  PCCM asked to assess resp failure and need for cpap/bipap.  SIGNIFICANT EVENTS  7/29 after tfr to floor from SDU, pt found sleeping and hypoxic diff to arouse and tfr back to SDU and hypercarbia on ABG and placed on bipap then to cpap  STUDIES:  Echo 7/29:  EF 45%   Hard to measure PAP   HISTORY OF PRESENT ILLNESS:   This is a 44 year old white male who normally does not see physicians. Patient has noted long-term chronic productive cough of thick brown mucus. Patient notes increasing shortness of breath and wheezing. Patient suffered significant weight gain with fluid retention in lower extremities and abdomen. The patient presented to the emergency room on 06/01/2015 and was admitted with increasing hypoxemia on room air. Note the patient has not been previously diagnosed with lung disease sleep apnea and is not on C Pap at home. The [place] [facility] [place] and then transferred from that unit over to [facility] hospital for admission.  Patient denies previous pneumonia. There is no chest pain. There is significant edema. Mucus typically has foamy brown material in it. There is no hemoptysis. Patient notes some nasal congestion. There is no reflux symptoms. Has been no fever chills or sweats. 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   PAST MEDICAL HISTORY :   has a past medical history of Hypertension.  has no past surgical history on file. Prior to Admission medications   Medication Sig Start Date End Date Taking? Authorizing Provider    Aspirin-Acetaminophen (GOODYS BODY PAIN PO) Take 1 packet by mouth daily.   Yes Historical Provider, MD  Multiple Vitamin (MULTIVITAMIN) tablet Take 1 tablet by mouth daily.   Yes Historical Provider, MD  Omega-3 Fatty Acids (OMEGA 3 PO) Take by mouth 2 (two) times daily.   Yes Historical Provider, MD  OVER THE COUNTER MEDICATION Take 1 tablet by mouth at bedtime.   Yes Historical Provider, MD   Allergies  Allergen Reactions  . Codeine Other (See Comments)    unknown    FAMILY HISTORY:  family history is not on file. SOCIAL HISTORY:  reports that he has been smoking.  He does not have any smokeless tobacco history on file. He reports that he does not drink alcohol.  REVIEW OF SYSTEMS:   Pos in BOLD Constitutional: Negative for fever, chills, weight loss, malaise/fatigue and diaphoresis.  HENT: Negative for hearing loss, ear pain, nosebleeds, congestion, sore throat, neck pain, tinnitus and ear discharge.   Eyes: Negative for blurred vision, double vision, photophobia, pain, discharge and redness.  Respiratory:  cough, hemoptysis, sputum production, shortness of breath, wheezing and stridor   Cardiovascular:  chest pain, palpitations, orthopnea, claudication, leg swelling and PND.  Gastrointestinal: Negative for heartburn, nausea, vomiting, abdominal pain, diarrhea, constipation, blood in stool and melena.  Genitourinary: Negative for dysuria, urgency, frequency, hematuria and flank pain.  Musculoskeletal: Negative for myalgias, back pain, joint pain and falls.  Skin: Negative for itching and rash.  Neurological: Negative for dizziness, tingling, tremors, sensory change, speech change, focal  weakness, seizures, loss of consciousness, weakness and headaches.  Endo/Heme/Allergies: Negative for environmental allergies and polydipsia. Does not bruise/bleed easily.  SUBJECTIVE:   VITAL SIGNS: Temp:  [98.1 F (36.7 C)-99.5 F (37.5 C)] 98.3 F (36.8 C) (07/30 0803) Pulse Rate:   [84-100] 90 (07/30 0803) Resp:  [13-23] 17 (07/30 0803) BP: (115-170)/(70-113) 129/70 mmHg (07/30 0803) SpO2:  [85 %-100 %] 95 % (07/30 0803) Weight:  [129.9 kg (286 lb 6 oz)] 129.9 kg (286 lb 6 oz) (07/30 0500)  PHYSICAL EXAMINATION: General:  Obese male in no acute distress sitting in a chair and is conversational eating breakfast Neuro:  No focal deficits HEENT:  Mucous membranes moist no jugular venous distention no thyromegaly neck supple Cardiovascular:  Regular rate and rhythm normal S1-S2 no S3 or S4 no murmur rub heave or gallop, significant edema in lower extremities pretibial areas Lungs:  Distant breath sounds with expired wheezes at bases Abdomen:  Soft nontender sounds active no organomegaly, protuberant Musculoskeletal:  Full range of motion no joint deformity Skin:  Clear   Recent Labs Lab 06/03/15 0239 06/04/15 0227 06/05/15 0208  NA 143 140 139  K 4.5 4.5 4.2  CL 97* 95* 96*  CO2 40* 38* 38*  BUN CREATININE 0.88 0.90 0.80  GLUCOSE 104* 89 82    Recent Labs Lab 06/01/15 1940 06/04/15 0227  HGB 17.2* 17.8*  HCT >55.0* 57.0*  WBC 12.4* 11.6*  PLT 229 220   Dg Chest Port 1 View  06/04/2015   CLINICAL DATA:  Lethargic.  Low O2 sats.  Hypertension, smoker.  EXAM: PORTABLE CHEST - 1 VIEW  COMPARISON:  06/01/2015  FINDINGS: Cardiomegaly. Mild peribronchial thickening and interstitial prominence is stable since prior study, likely chronic bronchitis. No acute airspace opacities, effusions or edema. No acute bony abnormality.  IMPRESSION: Cardiomegaly.  Stable peribronchial thickening and interstitial prominence, likely chronic bronchitis.   Electronically Signed   By: Charlett Nose M.D.   On: 06/04/2015 18:05    ASSESSMENT / PLAN: Principal Problem:   Acute combined systolic and diastolic congestive heart failure Active Problems:   Hypertensive urgency   Acute CHF   Tobacco use disorder   Hypertension, uncontrolled   Acute respiratory failure with  hypoxia and hypercarbia   OSA (obstructive sleep apnea)   COPD with acute exacerbation   #1 acute on chronic hypoxic and hypercarbic respiratory failure in the setting of likely acute exacerbation of chronic lung disease with COPD newly diagnosed.   Patient's symptom complex fits classic picture of chronic bronchitic. Patient also has likely some central drive issues as well as sleep apnea with obesity hyperventilation syndrome.  This patient would benefit from airway treatment and improvement in airflow function. Patient also would benefit from a home C Pap device and may yet also require oxygen 24 7 in the home. Smoking cessation will also be a significant key in the patient has good insight in this.     Plan   Continue diuresis and treatment of heart failure per cardiology   Begin DuoNeb nebulization or times daily   Begin IV Solu-Medrol 60 mg every 6 orders were written   Begin azithromycin for a 5 day course for tracheobronchitis   Continue C Pap on a nightly basis and discontinue BiPAP device from her   Begin flutter valve and incentive spirometry   Pulmonary outpatient follow-up will be obtained and patient will need outpatient    pulmonary function studies and sleep study  Luisa Hart WrightMD  Beeper  [phone]  Cell  [phone]  If no response or cell goes to voicemail, call beeper [phone]  Pulmonary and Critical Care Medicine [facility] Pager: [phone]  06/05/2015, 8:33 AM

## 2015-06-05 NOTE — Progress Notes (Signed)
SATURATION QUALIFICATIONS: (This note is used to comply with regulatory documentation for home oxygen)  Patient Saturations on Room Air at Rest = 86-90%  Patient Saturations on Room Air while Ambulating = 79-84%  Patient Saturations on 4 Liters of oxygen while Ambulating = 86-91%  Please briefly explain why patient needs home oxygen:Pt desats at rest without O2 as well as with activity without O2 therefore may need home O2.  Thanks.   [facility] Acute Rehabilitation [phone] (pager)

## 2015-06-05 NOTE — Progress Notes (Addendum)
Pt wore BiPAP for 1 hour. Pt alert and oriented with O2 saturation at 100%. Sitting on the side of the bed asking to be taken off the BiPAP. Agreed to wear CPAP at bedtime. Will continue to monitor.   Alba Destine, RN, BSN

## 2015-06-05 NOTE — Evaluation (Signed)
One time Physical Therapy Evaluation Patient Details Name: JO CERONE MRN: [id number] DOB: 1971-04-02 Today's Date: 06/05/2015   History of Present Illness  CHF: Multifactorial due to cor pulmonale OSA and obesity hypoventilation syndrome Good diuresis continue current dose of lasix Echo with EF 40-45%.  Clinical Impression  Pt admitted with above diagnosis. Pt currently without significant functional limitations and is able to ambulate without physical assist.  Pt ambulating well overall.  Did desat with activity.  May need home O2 however pt has no other equipment or f/u needs.  Pt will not need further inpatient PT services. Will sign off.        Follow Up Recommendations No PT follow up    Equipment Recommendations  None recommended by PT    Recommendations for Other Services       Precautions / Restrictions Precautions Precautions: Fall Restrictions Weight Bearing Restrictions: No      Mobility  Bed Mobility Overal bed mobility: Independent                Transfers Overall transfer level: Independent                  Ambulation/Gait Ambulation/Gait assistance: Independent   Assistive device: None Gait Pattern/deviations: WFL(Within Functional Limits)   Gait velocity interpretation: <1.8 ft/sec, indicative of risk for recurrent falls General Gait Details: Can accept challenges all directions.  No LOB.  Desats with activity without O2 therfore may need home O2.    Stairs            Wheelchair Mobility    Modified Rankin (Stroke Patients Only)       Balance Overall balance assessment: Needs assistance         Standing balance support: No upper extremity supported;During functional activity Standing balance-Leahy Scale: Good Standing balance comment: can accept challenges to balance and keep balance.                              Pertinent Vitals/Pain Pain Assessment: No/denies pain  SATURATION QUALIFICATIONS: (This  note is used to comply with regulatory documentation for home oxygen)  Patient Saturations on Room Air at Rest = 86-90%  Patient Saturations on Room Air while Ambulating = 79-84%  Patient Saturations on 4 Liters of oxygen while Ambulating = 86-91%  Please briefly explain why patient needs home oxygen:Pt desats at rest without O2 as well as with activity without O2 therefore may need home O2.    Home Living Family/patient expects to be discharged to:: Private residence Living Arrangements: Alone Available Help at Discharge: Available PRN/intermittently Type of Home: House Home Access: Stairs to enter Entrance Stairs-Rails: None Entrance Stairs-Number of Steps: 2 Home Layout: One level Home Equipment: None      Prior Function Level of Independence: Independent               Hand Dominance        Extremity/Trunk Assessment   Upper Extremity Assessment: Defer to OT evaluation           Lower Extremity Assessment: Overall WFL for tasks assessed      Cervical / Trunk Assessment: Normal  Communication   Communication: No difficulties  Cognition Arousal/Alertness: Awake/alert Behavior During Therapy: WFL for tasks assessed/performed Overall Cognitive Status: Within Functional Limits for tasks assessed                      General Comments  Exercises        Assessment/Plan    PT Assessment Patient needs continued PT services  PT Diagnosis Generalized weakness   PT Problem List Decreased activity tolerance;Decreased balance;Decreased mobility;Decreased knowledge of use of DME;Decreased safety awareness;Decreased knowledge of precautions;Cardiopulmonary status limiting activity  PT Treatment Interventions Gait training;Functional mobility training;Therapeutic activities;Therapeutic exercise;Patient/family education   PT Goals (Current goals can be found in the Care Plan section) Acute Rehab PT Goals PT Goal Formulation: All assessment and  education complete, DC therapy    Frequency     Barriers to discharge        Co-evaluation               End of Session Equipment Utilized During Treatment: Gait belt;Oxygen Activity Tolerance: Patient limited by fatigue Patient left: in chair;with call bell/phone within reach Nurse Communication: Mobility status         Time: 1610-9604 PT Time Calculation (min) (ACUTE ONLY): 12 min   Charges:   PT Evaluation $Initial PT Evaluation Tier I: 1 Procedure     PT G CodesBerline Lopes 06-12-2015, 12:40 PM Melford Tullier,PT Acute Rehabilitation [phone] (pager)

## 2015-06-05 NOTE — Progress Notes (Signed)
Patient ID: SOSTENES KAUFFMANN, male   DOB: 1971/04/04, 44 y.o.   MRN: [id number]   SUBJECTIVE:  Less dyspnic good diuresis   OBJECTIVE:   Vitals:   Filed Vitals:   06/05/15 0803 06/05/15 0900 06/05/15 1000 06/05/15 1041  BP: 129/70 141/87 116/75   Pulse: 90 95 79   Temp: 98.3 F (36.8 C)     TempSrc: Oral     Resp: 17 21 20    Height:      Weight:      SpO2: 95% 95% 95% 95%   I&O's:    Intake/Output Summary (Last 24 hours) at 06/05/15 1119 Last data filed at 06/05/15 1000  Gross per 24 hour  Intake      0 ml  Output   3300 ml  Net  -3300 ml   TELEMETRY: Reviewed telemetry pt in normal sinus rhythm:06/05/2015   Echo:  7/28 Impressions:  - Technically difficult study, images very poor even with Definity. Mildly dilated LV with mild LV hypertrophy. EF probably around 40-45%, difficult to comment on individual wall segments. Moderately dilated RV with mildly decreased systolic function.   PHYSICAL EXAM General: Obese male  Head:   Normal cephalic and atramatic  Lungs:   Clear bilaterally to auscultation. Heart:   HRRR S1 S2  No JVD.   Abdomen: abdomen soft and non-tender Msk:  Back normal,  Normal strength and tone for age. Extremities:   1+ pitting ankle edema.   Neuro: Alert and oriented. Psych:  Normal affect, responds appropriately Skin: No rash   LABS: Basic Metabolic Panel:  Recent Labs  11/91/47 0227 06/05/15 0208  NA 140 139  K 4.5 4.2  CL 95* 96*  CO2 38* 38*  GLUCOSE 89 82  BUN 11 12  CREATININE 0.90 0.80  CALCIUM 8.6* 8.5*   CBC:  Recent Labs  06/04/15 0227  WBC 11.6*  HGB 17.8*  HCT 57.0*  MCV 97.8  PLT 220   Cardiac Enzymes:  Recent Labs  06/02/15 1150 06/02/15 1828  TROPONINI 0.05* 0.05*    RADIOLOGY: Dg Chest Port 1 View  06/04/2015   CLINICAL DATA:  Lethargic.  Low O2 sats.  Hypertension, smoker.  EXAM: PORTABLE CHEST - 1 VIEW  COMPARISON:  06/01/2015  FINDINGS: Cardiomegaly. Mild peribronchial thickening and  interstitial prominence is stable since prior study, likely chronic bronchitis. No acute airspace opacities, effusions or edema. No acute bony abnormality.  IMPRESSION: Cardiomegaly.  Stable peribronchial thickening and interstitial prominence, likely chronic bronchitis.   Electronically Signed   By: Charlett Nose M.D.   On: 06/04/2015 18:05   Dg Chest Port 1 View  06/01/2015   CLINICAL DATA:  Hypoxia for 6 months. Bilateral lower extremity swelling today.  EXAM: PORTABLE CHEST - 1 VIEW  COMPARISON:  None.  FINDINGS: There is cardiomegaly and pulmonary vascular congestion. No consolidative process, pneumothorax or effusion.  IMPRESSION: Cardiomegaly without acute disease.   Electronically Signed   By: Drusilla Kanner M.D.   On: 06/01/2015 20:16    Current facility-administered medications:  .  0.9 %  sodium chloride infusion, 250 mL, Intravenous, PRN, Hillary Bow, DO, Stopped at 06/03/15 [phone] .  acetaminophen (TYLENOL) tablet 650 mg, 650 mg, Oral, Q4H PRN, Hillary Bow, DO, 650 mg at 06/05/15 0047 .  antiseptic oral rinse (CPC / CETYLPYRIDINIUM CHLORIDE 0.05%) solution 7 mL, 7 mL, Mouth Rinse, BID, Jared M Gardner, DO, 7 mL at 06/05/15 1019 .  aspirin tablet 325 mg, 325 mg, Oral, Daily,  Osvaldo Shipper, MD, 325 mg at 06/05/15 1018 .  [COMPLETED] azithromycin (ZITHROMAX) tablet 500 mg, 500 mg, Oral, Daily, 500 mg at 06/05/15 1019 **FOLLOWED BY** [START ON 06/06/2015] azithromycin (ZITHROMAX) tablet 250 mg, 250 mg, Oral, Daily, Storm Frisk, MD .  calcium carbonate (TUMS - dosed in mg elemental calcium) chewable tablet 200-400 mg of elemental calcium, 1-2 tablet, Oral, TID PRN, Osvaldo Shipper, MD, 200 mg of elemental calcium at 06/03/15 2144 .  furosemide (LASIX) injection 40 mg, 40 mg, Intravenous, BID, Jared M Gardner, DO, 40 mg at 06/05/15 1610 .  heparin injection 5,000 Units, 5,000 Units, Subcutaneous, 3 times per day, Hillary Bow, DO, 5,000 Units at 06/05/15 0602 .  hydrALAZINE  (APRESOLINE) injection 10 mg, 10 mg, Intravenous, Q4H PRN, Hillary Bow, DO, 10 mg at 06/04/15 9604 .  ipratropium-albuterol (DUONEB) 0.5-2.5 (3) MG/3ML nebulizer solution 3 mL, 3 mL, Nebulization, QID, Storm Frisk, MD, 3 mL at 06/05/15 1036 .  lisinopril (PRINIVIL,ZESTRIL) tablet 20 mg, 20 mg, Oral, Daily, Corky Crafts, MD, 20 mg at 06/05/15 1018 .  methylPREDNISolone sodium succinate (SOLU-MEDROL) 125 mg/2 mL injection 60 mg, 60 mg, Intravenous, Q6H, Storm Frisk, MD, 60 mg at 06/05/15 0857 .  nicotine (NICODERM CQ - dosed in mg/24 hours) patch 21 mg, 21 mg, Transdermal, Daily, Nicole Pisciotta, PA-C, 21 mg at 06/05/15 1019 .  ondansetron (ZOFRAN) injection 4 mg, 4 mg, Intravenous, Q6H PRN, Hillary Bow, DO .  sodium chloride 0.9 % injection 3 mL, 3 mL, Intravenous, Q12H, Jared M Gardner, DO, 3 mL at 06/05/15 1025 .  sodium chloride 0.9 % injection 3 mL, 3 mL, Intravenous, PRN, Hillary Bow, DO .  traMADol Janean Sark) tablet 50 mg, 50 mg, Oral, Q6H PRN, Osvaldo Shipper, MD, 50 mg at 06/05/15 5409    ASSESSMENT: [name]:    CHF:  Multifactorial due to cor pulmonale OSA and obesity hypoventilation syndrome  Good diuresis continue current dose of lasix  Echo with EF 40-45%  Elevated EDP Ace increased yesterday  Change lasix to PO in am  Outpatient f/u Dr Eldridge Dace will likely need right and left heart cath after on good medical Rx and home CPAP  Wendall Stade, MD  06/05/2015  11:19 AM

## 2015-06-06 NOTE — Progress Notes (Signed)
Name: Bob Rodriguez MRN: [id number] DOB: 09-Jun-1971    ADMISSION DATE:  06/01/2015 CONSULTATION DATE:  06/06/2015  REFERRING MD :  TRH  CHIEF COMPLAINT:  Hypercarbia, dyspnea, prod cough  BRIEF PATIENT DESCRIPTION:  44 y.o.M 2PPD smoker with AECOPD newly dx , acute on chronic hypoxia/hypercarbia resp failure, Acute systolic HF new dx EF 45% HTN, does not see MDs regularly.  PCCM asked to assess resp failure and need for cpap/bipap.  SIGNIFICANT EVENTS  7/29 after tfr to floor from SDU, pt found sleeping and hypoxic diff to arouse and tfr back to SDU and hypercarbia on ABG and placed on bipap then to cpap  STUDIES:  Echo 7/29:  EF 45%   Hard to measure PAP   SUBJECTIVE:  Less dyspnea, coughing up mucus  VITAL SIGNS: Temp:  [97.9 F (36.6 C)-98.9 F (37.2 C)] 98.1 F (36.7 C) (07/31 0816) Pulse Rate:  [79-102] 90 (07/31 0816) Resp:  [13-23] 20 (07/31 0816) BP: (114-156)/(54-91) 140/79 mmHg (07/31 0816) SpO2:  [80 %-97 %] 93 % (07/31 0853) Weight:  [127.3 kg (280 lb 10.3 oz)] 127.3 kg (280 lb 10.3 oz) (07/31 0544)  PHYSICAL EXAMINATION: General:  Obese male in no acute distress sitting in a chair and is conversational eating breakfast Neuro:  No focal deficits HEENT:  Mucous membranes moist no jugular venous distention no thyromegaly neck supple Cardiovascular:  Regular rate and rhythm normal S1-S2 no S3 or S4 no murmur rub heave or gallop, significant edema in lower extremities pretibial areas Lungs:  Distant breath sounds with decrease wheezes Abdomen:  Soft nontender sounds active no organomegaly, protuberant Musculoskeletal:  Full range of motion no joint deformity Skin:  Clear   Recent Labs Lab 06/03/15 0239 06/04/15 0227 06/05/15 0208  NA 143 140 139  K 4.5 4.5 4.2  CL 97* 95* 96*  CO2 40* 38* 38*  BUN CREATININE 0.88 0.90 0.80  GLUCOSE 104* 89 82    Recent Labs Lab 06/01/15 1940 06/04/15 0227  HGB 17.2* 17.8*  HCT >55.0* 57.0*  WBC 12.4*  11.6*  PLT 229 220   Dg Chest Port 1 View  06/04/2015   CLINICAL DATA:  Lethargic.  Low O2 sats.  Hypertension, smoker.  EXAM: PORTABLE CHEST - 1 VIEW  COMPARISON:  06/01/2015  FINDINGS: Cardiomegaly. Mild peribronchial thickening and interstitial prominence is stable since prior study, likely chronic bronchitis. No acute airspace opacities, effusions or edema. No acute bony abnormality.  IMPRESSION: Cardiomegaly.  Stable peribronchial thickening and interstitial prominence, likely chronic bronchitis.   Electronically Signed   By: Charlett Nose M.D.   On: 06/04/2015 18:05    ASSESSMENT / PLAN: Principal Problem:   Acute combined systolic and diastolic congestive heart failure Active Problems:   Hypertensive urgency   Acute CHF   Tobacco use disorder   Hypertension, uncontrolled   Acute respiratory failure with hypoxia and hypercarbia   OSA (obstructive sleep apnea)   COPD with acute exacerbation   #1 acute on chronic hypoxic and hypercarbic respiratory failure in the setting of likely acute exacerbation of chronic lung disease with COPD newly diagnosed.   Patient's symptom complex fits classic picture of chronic bronchitic. Patient also has likely some central drive issues as well as sleep apnea with obesity hyperventilation syndrome.  This patient would benefit from airway treatment and improvement in airflow function. Patient also would benefit from a home C Pap device and may yet also require oxygen 24 7 in the home. Smoking  cessation will also be a significant key in the patient has good insight in this.   Pt improved 7/31.     Plan   Continue diuresis and treatment of heart failure per cardiology   Cont  DuoNeb nebulization or times daily   Cont V Solu-Medrol 60 mg every 6 orders were written   Cont azithromycin for a 5 day course for tracheobronchitis   Continue C Pap on a nightly basis and discontinue BiPAP device from her   Cont  flutter valve and incentive  spirometry   Pulmonary outpatient follow-up will be obtained and patient will need outpatient    pulmonary function studies and sleep study  Dorcas Carrow Beeper  [phone]  Cell  [phone]  If no response or cell goes to voicemail, call beeper [phone]  Pulmonary and Critical Care Medicine [facility] Pager: [phone]  06/06/2015, 9:39 AM

## 2015-06-06 NOTE — Progress Notes (Signed)
Follow up visit to place patient on CPAP for QHS. Patient out of bed in the chair and not ready for CPAP at this time. Instructed patient to call for assistance when ready.

## 2015-06-06 NOTE — Progress Notes (Signed)
Patient ID: Bob Rodriguez, male   DOB: 10-20-1971, 44 y.o.   MRN: [id number]   SUBJECTIVE:  Hypercarbic respiratory failure with CO2 70  Good diuresis less dyspnea   OBJECTIVE:   Vitals:   Filed Vitals:   06/06/15 0544 06/06/15 0816 06/06/15 0853 06/06/15 0947  BP: 144/85 140/79  131/75  Pulse: 94 90    Temp: 97.9 F (36.6 C) 98.1 F (36.7 C)    TempSrc: Axillary Oral    Resp: 16 20    Height:      Weight: 127.3 kg (280 lb 10.3 oz)     SpO2:  90% 93%    I&O's:    Intake/Output Summary (Last 24 hours) at 06/06/15 1156 Last data filed at 06/06/15 0954  Gross per 24 hour  Intake      0 ml  Output   2675 ml  Net  -2675 ml   TELEMETRY: Reviewed telemetry pt in normal sinus rhythm:06/06/2015   Echo:  7/28 Impressions:  - Technically difficult study, images very poor even with Definity. Mildly dilated LV with mild LV hypertrophy. EF probably around 40-45%, difficult to comment on individual wall segments. Moderately dilated RV with mildly decreased systolic function.   PHYSICAL EXAM General: Obese male  Head:   Normal cephalic and atramatic  Lungs:   Clear bilaterally to auscultation. Heart:   HRRR S1 S2  No JVD.   Abdomen: abdomen soft and non-tender Msk:  Back normal,  Normal strength and tone for age. Extremities:   1+ pitting ankle edema.   Neuro: Alert and oriented. Psych:  Normal affect, responds appropriately Skin: No rash   LABS: Basic Metabolic Panel:  Recent Labs  95/28/41 0227 06/05/15 0208  NA 140 139  K 4.5 4.2  CL 95* 96*  CO2 38* 38*  GLUCOSE 89 82  BUN 11 12  CREATININE 0.90 0.80  CALCIUM 8.6* 8.5*   CBC:  Recent Labs  06/04/15 0227  WBC 11.6*  HGB 17.8*  HCT 57.0*  MCV 97.8  PLT 220   Cardiac Enzymes: No results for input(s): CKTOTAL, CKMB, CKMBINDEX, TROPONINI in the last 72 hours.  RADIOLOGY: Dg Chest Port 1 View  06/04/2015   CLINICAL DATA:  Lethargic.  Low O2 sats.  Hypertension, smoker.  EXAM: PORTABLE CHEST - 1  VIEW  COMPARISON:  06/01/2015  FINDINGS: Cardiomegaly. Mild peribronchial thickening and interstitial prominence is stable since prior study, likely chronic bronchitis. No acute airspace opacities, effusions or edema. No acute bony abnormality.  IMPRESSION: Cardiomegaly.  Stable peribronchial thickening and interstitial prominence, likely chronic bronchitis.   Electronically Signed   By: Charlett Nose M.D.   On: 06/04/2015 18:05   Dg Chest Port 1 View  06/01/2015   CLINICAL DATA:  Hypoxia for 6 months. Bilateral lower extremity swelling today.  EXAM: PORTABLE CHEST - 1 VIEW  COMPARISON:  None.  FINDINGS: There is cardiomegaly and pulmonary vascular congestion. No consolidative process, pneumothorax or effusion.  IMPRESSION: Cardiomegaly without acute disease.   Electronically Signed   By: Drusilla Kanner M.D.   On: 06/01/2015 20:16    Current facility-administered medications:  .  0.9 %  sodium chloride infusion, 250 mL, Intravenous, PRN, Hillary Bow, DO, Stopped at 06/03/15 [phone] .  acetaminophen (TYLENOL) tablet 650 mg, 650 mg, Oral, Q4H PRN, Hillary Bow, DO, 650 mg at 06/05/15 0047 .  antiseptic oral rinse (CPC / CETYLPYRIDINIUM CHLORIDE 0.05%) solution 7 mL, 7 mL, Mouth Rinse, BID, Hillary Bow, DO, 7  mL at 06/06/15 1000 .  aspirin tablet 325 mg, 325 mg, Oral, Daily, Osvaldo Shipper, MD, 325 mg at 06/06/15 0947 .  [COMPLETED] azithromycin (ZITHROMAX) tablet 500 mg, 500 mg, Oral, Daily, 500 mg at 06/05/15 1019 **FOLLOWED BY** azithromycin (ZITHROMAX) tablet 250 mg, 250 mg, Oral, Daily, Storm Frisk, MD, 250 mg at 06/06/15 0947 .  calcium carbonate (TUMS - dosed in mg elemental calcium) chewable tablet 200-400 mg of elemental calcium, 1-2 tablet, Oral, TID PRN, Osvaldo Shipper, MD, 200 mg of elemental calcium at 06/03/15 2144 .  furosemide (LASIX) injection 40 mg, 40 mg, Intravenous, BID, Jared M Gardner, DO, 40 mg at 06/06/15 4696 .  heparin injection 5,000 Units, 5,000 Units,  Subcutaneous, 3 times per day, Hillary Bow, DO, 5,000 Units at 06/06/15 [phone] .  hydrALAZINE (APRESOLINE) injection 10 mg, 10 mg, Intravenous, Q4H PRN, Hillary Bow, DO, 10 mg at 06/04/15 8413 .  ipratropium-albuterol (DUONEB) 0.5-2.5 (3) MG/3ML nebulizer solution 3 mL, 3 mL, Nebulization, QID, Storm Frisk, MD, 3 mL at 06/06/15 0853 .  lisinopril (PRINIVIL,ZESTRIL) tablet 20 mg, 20 mg, Oral, Daily, Corky Crafts, MD, 20 mg at 06/06/15 0947 .  methylPREDNISolone sodium succinate (SOLU-MEDROL) 125 mg/2 mL injection 60 mg, 60 mg, Intravenous, Q6H, Storm Frisk, MD, 60 mg at 06/06/15 2440 .  nicotine (NICODERM CQ - dosed in mg/24 hours) patch 21 mg, 21 mg, Transdermal, Daily, Nicole Pisciotta, PA-C, 21 mg at 06/06/15 0946 .  ondansetron (ZOFRAN) injection 4 mg, 4 mg, Intravenous, Q6H PRN, Hillary Bow, DO .  sodium chloride 0.9 % injection 3 mL, 3 mL, Intravenous, Q12H, Hillary Bow, DO, 3 mL at 06/05/15 2130 .  sodium chloride 0.9 % injection 3 mL, 3 mL, Intravenous, PRN, Hillary Bow, DO .  traMADol Janean Sark) tablet 50 mg, 50 mg, Oral, Q6H PRN, Osvaldo Shipper, MD, 50 mg at 06/06/15 1027    ASSESSMENT: [name]:    CHF:  Multifactorial due to cor pulmonale OSA and obesity hypoventilation syndrome  Good diuresis Change lasix to PO   Echo with EF 40-45%  Elevated EDP Ace increased 7/30  Cr/K are fine     Outpatient f/u Dr Eldridge Dace will likely need right and left heart cath after on good medical Rx and home CPAP Primary issue Appears to be hypercarbic respiratory failure   Wendall Stade, MD  06/06/2015  11:56 AM

## 2015-06-06 NOTE — Progress Notes (Signed)
Pt states he will put CPAP on when he is ready. RT will check back with patient later tonight.

## 2015-06-06 NOTE — Progress Notes (Signed)
RT in room to follow up with patient in regards to going on CPAP for QHS. Patient not ready for CPAP at this time.

## 2015-06-06 NOTE — Progress Notes (Signed)
[facility] PROGRESS NOTE  Bob Rodriguez AOZ:[id number] DOB: 08/07/1971 DOA: 06/01/2015  PCP: Setup at [facility] on Aug 4.  Brief HPI: 44 year old Caucasian male with a past medical history of hypertension, presented with complains of lower extremity edema. In the emergency department he was noted to be hypoxic in the 80s. He was noted to desaturate while sleeping. He was placed on BiPAP. He was hospitalized for further management. He was changed over to CPAP. His swelling is improving with diuretics.   Past medical history:  Past Medical History  Diagnosis Date  . Hypertension     Consultants: Cardiology, pulmonology  Procedures:  2-D echocardiogram Study Conclusions - Left ventricle: The cavity size was mildly dilated. Wallthickness was increased in a pattern of mild LVH. Systolicfunction was mildly to moderately reduced. The estimated ejectionfraction was in the range of 40% to 45%. Images were inadequatefor LV wall motion assessment. Features are consistent with apseudonormal left ventricular filling pattern, with concomitantabnormal relaxation and increased filling pressure (grade 2diastolic dysfunction). - Aortic valve: There was no stenosis. - Mitral valve: There was no significant regurgitation. - Left atrium: The atrium was mildly dilated. - Right ventricle: The cavity size was moderately dilated. Systolicfunction was mildly reduced. - Right atrium: The atrium was mildly dilated. - Tricuspid valve: Poorly visualized. - Pulmonary arteries: No complete TR doppler jet so unable toestimate PA systolic pressure. Impressions: - Technically difficult study, images very poor even with Definity.Mildly dilated LV with mild LV hypertrophy. EF probably around40-45%, difficult to comment on individual wall segments.Moderately dilated RV with mildly decreased systolic function.  Lower extremity venous Dopplers Negative for DVT. Baker cyst noted on the  right.  Antibiotics: None  Subjective: Patient denies any complaints this morning. Breathing is improved. Denies any chest pain. Tolerating his diet. Feels as if the swelling is improving.   Objective: Vital Signs  Filed Vitals:   06/05/15 2347 06/06/15 0320 06/06/15 0337 06/06/15 0544  BP: 130/79 150/79  144/85  Pulse: 90 88  94  Temp: 98.5 F (36.9 C)   97.9 F (36.6 C)  TempSrc: Oral   Axillary  Resp: Height:      Weight:    127.3 kg (280 lb 10.3 oz)  SpO2: 97% 90% 92%     Intake/Output Summary (Last 24 hours) at 06/06/15 0724 Last data filed at 06/05/15 2348  Gross per 24 hour  Intake      0 ml  Output   3050 ml  Net  -3050 ml   Filed Weights   06/04/15 0459 06/05/15 0500 06/06/15 0544  Weight: 133.6 kg (294 lb 8.6 oz) 129.9 kg (286 lb 6 oz) 127.3 kg (280 lb 10.3 oz)    General appearance: alert, cooperative, appears stated age, no distress and morbidly obese Resp: Improving air entry bilaterally. Few crackles at the bases but much improved compared to the last few days. No wheezing. Cardio: regular rate and rhythm, S1, S2 normal, no murmur, click, rub or gallop GI: soft, non-tender; bowel sounds normal; no masses,  no organomegaly Extremities: 1+ edema bilateral lower extremities. improving. Neurologic: No focal deficits  Lab Results:  Basic Metabolic Panel:  Recent Labs Lab 06/01/15 1940 06/03/15 0239 06/04/15 0227 06/05/15 0208  NA 145 143 140 139  K 4.3 4.5 4.5 4.2  CL 92* 97* 95* 96*  CO2 36* 40* 38* 38*  GLUCOSE 110* 104* 89 82  BUN CREATININE 0.86 0.88 0.90 0.80  CALCIUM 9.8 8.6* 8.6* 8.5*   Liver Function Tests:  Recent Labs Lab 06/01/15 1940  AST 40  ALT 43  ALKPHOS 79  BILITOT 0.8  PROT 6.2*  ALBUMIN 3.3*   CBC:  Recent Labs Lab 06/01/15 1940 06/04/15 0227  WBC 12.4* 11.6*  NEUTROABS 7.6  --   HGB 17.2* 17.8*  HCT >55.0* 57.0*  MCV 95.2 97.8  PLT 229 220   Cardiac Enzymes:  Recent Labs Lab  06/01/15 1940 06/02/15 0439 06/02/15 1150 06/02/15 1828  TROPONINI 0.07* 0.05* 0.05* 0.05*   BNP (last 3 results)  Recent Labs  06/01/15 1940  BNP 315.6*     Recent Results (from the past 240 hour(s))  MRSA PCR Screening     Status: None   Collection Time: 06/02/15  3:52 AM  Result Value Ref Range Status   MRSA by PCR NEGATIVE NEGATIVE Final    Comment:        The GeneXpert MRSA Assay (FDA approved for NASAL specimens only), is one component of a comprehensive MRSA colonization surveillance program. It is not intended to diagnose MRSA infection nor to guide or monitor treatment for MRSA infections.       Studies/Results: Dg Chest Port 1 View  06/04/2015   CLINICAL DATA:  Lethargic.  Low O2 sats.  Hypertension, smoker.  EXAM: PORTABLE CHEST - 1 VIEW  COMPARISON:  06/01/2015  FINDINGS: Cardiomegaly. Mild peribronchial thickening and interstitial prominence is stable since prior study, likely chronic bronchitis. No acute airspace opacities, effusions or edema. No acute bony abnormality.  IMPRESSION: Cardiomegaly.  Stable peribronchial thickening and interstitial prominence, likely chronic bronchitis.   Electronically Signed   By: Charlett Nose M.D.   On: 06/04/2015 18:05    Medications:  Scheduled: . antiseptic oral rinse  7 mL Mouth Rinse BID  . aspirin  325 mg Oral Daily  . azithromycin  250 mg Oral Daily  . furosemide  40 mg Intravenous BID  . heparin  5,000 Units Subcutaneous 3 times per day  . ipratropium-albuterol  3 mL Nebulization QID  . lisinopril  20 mg Oral Daily  . methylPREDNISolone (SOLU-MEDROL) injection  60 mg Intravenous Q6H  . nicotine  21 mg Transdermal Daily  . sodium chloride  3 mL Intravenous Q12H   Continuous:   RUE:AVWUJW chloride, acetaminophen, calcium carbonate, hydrALAZINE, ondansetron (ZOFRAN) IV, sodium chloride, traMADol  Assessment/Plan:  Principal Problem:   Acute combined systolic and diastolic congestive heart failure Active  Problems:   Hypertensive urgency   Acute CHF   Tobacco use disorder   Hypertension, uncontrolled   Acute respiratory failure with hypoxia and hypercarbia   OSA (obstructive sleep apnea)   COPD with acute exacerbation    Acute respiratory failure with hypoxia Multifactorial. Patient noted to have combined systolic and diastolic congestive heart failure. He also appears to have an element of obstructive sleep apnea and possibly even obesity hypoventilation syndrome. Patient seems to be stable. Tolerated CPAP overnight. Still with occasional episodes of hypoxia. Seen by pulmonology yesterday. Started on nebulizer treatments and steroids along with antibiotics. Patient will need home oxygen along with CPAP. Will need outpatient sleep study. Discussed with Dr. Delford Field.  Likely OHS/OSA Lifestyle changes discussed with the patient. Compliance with medication emphasized. See above.  Acute combined systolic and diastolic congestive heart failure with lower extremity edema Echocardiogram as above. Cardiology is following. Lasix is being continued. Patient's lost about 8 kg during this hospitalization. Continue diuretics. Continue to monitor ins and outs. Daily weights. TSH  is normal.    Hypertensive urgency Blood pressure is improved. Continue current dose of lisinopril. Patient was on intravenous nitroglycerin initially, which was discontinued.   Mildly elevated troponin Likely secondary to demand ischemia. No plans for inpatient workup. Cardiac catheterization to be pursued as an outpatient. Aspirin.  Polycythemia Likely secondary due to smoking.  Tobacco abuse Nicotine patch  DVT Prophylaxis: Subcutaneous heparin    Code Status: Full code  Family Communication: Discussed with the patient  Disposition Plan: Will remain in step down today. Start mobilizing. Will need case management assistance to set up home oxygen and CPAP.  Follow-up Appointment?: [facility]   LOS: 5 days   [facility]  [facility] Pager [phone] 06/06/2015, 7:24 AM  If 7PM-7AM, please contact night-coverage at [email or web address], password [facility]

## 2015-06-07 DIAGNOSIS — J9601 Acute respiratory failure with hypoxia: Secondary | ICD-10-CM

## 2015-06-07 DIAGNOSIS — J441 Chronic obstructive pulmonary disease with (acute) exacerbation: Secondary | ICD-10-CM

## 2015-06-07 LAB — CBC
HCT: 57.5 % — ABNORMAL HIGH (ref 39.0–52.0)
Hemoglobin: 18.5 g/dL — ABNORMAL HIGH (ref 13.0–17.0)
MCH: 30.6 pg (ref 26.0–34.0)
MCHC: 32.2 g/dL (ref 30.0–36.0)
MCV: 95.2 fL (ref 78.0–100.0)
Platelets: 242 10*3/uL (ref 150–400)
RBC: 6.04 MIL/uL — ABNORMAL HIGH (ref 4.22–5.81)
RDW: 14.9 % (ref 11.5–15.5)
WBC: 19.8 10*3/uL — ABNORMAL HIGH (ref 4.0–10.5)

## 2015-06-07 LAB — BASIC METABOLIC PANEL
Anion gap: 12 (ref 5–15)
BUN: 22 mg/dL — ABNORMAL HIGH (ref 6–20)
CHLORIDE: 94 mmol/L — AB (ref 101–111)
CO2: 32 mmol/L (ref 22–32)
Calcium: 9.1 mg/dL (ref 8.9–10.3)
Creatinine, Ser: 0.75 mg/dL (ref 0.61–1.24)
GFR calc non Af Amer: >60 mL/min (ref >60)
Glucose, Bld: 126 mg/dL — ABNORMAL HIGH (ref 65–99)
Potassium: 4.4 mmol/L (ref 3.5–5.1)
Sodium: 138 mmol/L (ref 135–145)

## 2015-06-07 MED ORDER — PREDNISONE 50 MG PO TABS
60.0000 mg | ORAL_TABLET | Freq: Every day | ORAL | Status: DC
  Administered 2015-06-08 – 2015-06-11 (×4): 60 mg via ORAL
  Filled 2015-06-07 (×5): qty 1

## 2015-06-07 NOTE — Progress Notes (Signed)
Pt has appointment @ [facility] for Thursday, August 4th, @ 10:30.  Referral form for sleep study @ [facility] is on shadow chart for MD signature.  Pt has no insurance and will qualify for [facility] program for 34-day supply of medication when discharged.

## 2015-06-07 NOTE — Plan of Care (Signed)
Problem: Consults Goal: Heart Failure Patient Education (See Patient Education module for education specifics.)  Outcome: Completed/Met Date Met:  06/07/15 Attached to patient discharge planning. Discussed with patient per MD and RN. Goal: Diabetes Guidelines if Diabetic/Glucose > 140 If diabetic or lab glucose is > 140 mg/dl - Initiate Diabetes/Hyperglycemia Guidelines & Document Interventions  Outcome: Not Applicable Date Met:  73/75/05 Patient glucose level 80-126. NA for >140

## 2015-06-07 NOTE — Progress Notes (Addendum)
Nutrition Consult/Brief Note  RD consulted for diet education.  Wt Readings from Last 15 Encounters:  06/07/15 285 lb 7.9 oz (129.5 kg)    Pt receiving breathing treatment during RD visit.  Provided "Hypertension Nutrition Therapy" handout from the [employer] of [employer].  Pt opted to review information on his own time.  Body mass index is 44.7 kg/(m^2). Patient meets criteria for Obesity Class III based on current BMI.   Current diet order is Heart Healthy, patient is consuming approximately 100% of meals at this time. Labs and medications reviewed.   No nutrition interventions warranted at this time. If nutrition issues arise, please consult RD.   Maureen Chatters, RD, LDN Pager #: [phone] After-Hours Pager #: [phone]

## 2015-06-07 NOTE — Progress Notes (Signed)
Patient ID: RANARD HARTE, male   DOB: 12-09-1970, 44 y.o.   MRN: [id number]   SUBJECTIVE:  Hypercarbic respiratory failure with CO2 70  Good diuresis (16 liters) less dyspnea. Still desat when getting out of bed.   OBJECTIVE:   Vitals:   Filed Vitals:   06/07/15 0739 06/07/15 0800 06/07/15 0912 06/07/15 1136  BP: 151/98  146/87   Pulse: 110     Temp: 97.6 F (36.4 C)     TempSrc: Oral     Resp: 19     Height:      Weight:      SpO2:  94%  96%   I&O's:    Intake/Output Summary (Last 24 hours) at 06/07/15 1145 Last data filed at 06/07/15 0915  Gross per 24 hour  Intake    486 ml  Output   1800 ml  Net  -1314 ml   TELEMETRY: Reviewed telemetry pt in normal sinus rhythm:06/07/2015   Echo:  7/28 Impressions:  - Technically difficult study, images very poor even with Definity. Mildly dilated LV with mild LV hypertrophy. EF probably around 40-45%, difficult to comment on individual wall segments. Moderately dilated RV with mildly decreased systolic function.   PHYSICAL EXAM General: Obese male  Head:   Normal cephalic and atramatic  Lungs:   Clear bilaterally to auscultation. No wheeze currently Heart:   HRRR S1 S2  No JVD.   Abdomen: abdomen soft and non-tender Msk:  Back normal,  Normal strength and tone for age. Extremities:   2+ pitting ankle/LE  edema.   Neuro: Alert and oriented. Psych:  Normal affect, responds appropriately Skin: No rash   LABS: Basic Metabolic Panel:  Recent Labs  82/95/62 0208 06/07/15 0226  NA 139 138  K 4.2 4.4  CL 96* 94*  CO2 38* 32  GLUCOSE 82 126*  BUN 12 22*  CREATININE 0.80 0.75  CALCIUM 8.5* 9.1   CBC:  Recent Labs  06/07/15 0226  WBC 19.8*  HGB 18.5*  HCT 57.5*  MCV 95.2  PLT 242   Cardiac Enzymes: No results for input(s): CKTOTAL, CKMB, CKMBINDEX, TROPONINI in the last 72 hours.  RADIOLOGY: Dg Chest Port 1 View  06/04/2015   CLINICAL DATA:  Lethargic.  Low O2 sats.  Hypertension, smoker.  EXAM:  PORTABLE CHEST - 1 VIEW  COMPARISON:  06/01/2015  FINDINGS: Cardiomegaly. Mild peribronchial thickening and interstitial prominence is stable since prior study, likely chronic bronchitis. No acute airspace opacities, effusions or edema. No acute bony abnormality.  IMPRESSION: Cardiomegaly.  Stable peribronchial thickening and interstitial prominence, likely chronic bronchitis.   Electronically Signed   By: Charlett Nose M.D.   On: 06/04/2015 18:05   Dg Chest Port 1 View  06/01/2015   CLINICAL DATA:  Hypoxia for 6 months. Bilateral lower extremity swelling today.  EXAM: PORTABLE CHEST - 1 VIEW  COMPARISON:  None.  FINDINGS: There is cardiomegaly and pulmonary vascular congestion. No consolidative process, pneumothorax or effusion.  IMPRESSION: Cardiomegaly without acute disease.   Electronically Signed   By: Drusilla Kanner M.D.   On: 06/01/2015 20:16   Scheduled Meds: . antiseptic oral rinse  7 mL Mouth Rinse BID  . aspirin  325 mg Oral Daily  . azithromycin  250 mg Oral Daily  . furosemide  40 mg Intravenous BID  . heparin  5,000 Units Subcutaneous 3 times per day  . ipratropium-albuterol  3 mL Nebulization QID  . lisinopril  20 mg Oral Daily  . nicotine  21 mg Transdermal Daily  . [START ON 06/08/2015] predniSONE  60 mg Oral Q breakfast  . sodium chloride  3 mL Intravenous Q12H   Continuous Infusions:  PRN Meds:.sodium chloride, acetaminophen, calcium carbonate, hydrALAZINE, ondansetron (ZOFRAN) IV, sodium chloride, traMADol    ASSESSMENT: [name]:    44 year old smoker with hypercarbic respiratory failure from multiple sources, acute systolic heart failure, COPD, obesity.  1. Acute systolic heart failure  - 16 liters out total  - more to go (LE edema noted), mild JVD  - continue IV lasix 40 BID  - creat stable  - on ACE-I lisinopril  - would like to add beta blocker but hesitant given recent lung pathology. If OK with pulmonary, may consider adding bisoprolol.  - EF 40%  2.  Cardiomyopathy  -  Outpatient f/u Dr Eldridge Dace will likely need right and left heart cath after on good medical Rx and home CPAP  3. Acute hypercarbic respiratory failure  - per pulmonary  - leukocytosis in part from prednisone  - nebs  4. Tobacco use  - wearing patch. Has not wanted cig. Encourage cessation.  Jake Bathe, MD  06/07/2015  11:45 AM

## 2015-06-07 NOTE — Evaluation (Signed)
Occupational Therapy Evaluation Patient Details Name: THAISON KOLODZIEJSKI MRN: [id number] DOB: [birth year] Today's Date: 06/07/2015    History of Present Illness CHF: Multifactorial due to cor pulmonale OSA and obesity hypoventilation syndrome Good diuresis continue current dose of lasix Echo with EF 40-45%.   Clinical Impression   Pt is performing ADL at a modified independent level. Educated in energy conservation strategies.  No further OT needs.   Follow Up Recommendations  No OT follow up    Equipment Recommendations  None recommended by OT    Recommendations for Other Services       Precautions / Restrictions Precautions Precautions: None Restrictions Weight Bearing Restrictions: No      Mobility Bed Mobility               General bed mobility comments: pt in chair  Transfers Overall transfer level: Independent                    Balance             Standing balance-Leahy Scale: Good                              ADL Overall ADL's : Modified independent                                       General ADL Comments: Educated pt in energy conservation.     Vision     Perception     Praxis      Pertinent Vitals/Pain Pain Assessment: No/denies pain     Hand Dominance Right   Extremity/Trunk Assessment Upper Extremity Assessment Upper Extremity Assessment: Overall WFL for tasks assessed   Lower Extremity Assessment Lower Extremity Assessment: Overall WFL for tasks assessed   Cervical / Trunk Assessment Cervical / Trunk Assessment: Normal   Communication Communication Communication: No difficulties   Cognition Arousal/Alertness: Awake/alert Behavior During Therapy: WFL for tasks assessed/performed Overall Cognitive Status: Within Functional Limits for tasks assessed                     General Comments       Exercises       Shoulder Instructions      Home Living Family/patient  expects to be discharged to:: Private residence Living Arrangements: Alone Available Help at Discharge: Available PRN/intermittently Type of Home: House Home Access: Stairs to enter Secretary/administrator of Steps: 2 Entrance Stairs-Rails: None Home Layout: One level     Bathroom Shower/Tub: Chief Strategy Officer: Standard     Home Equipment: None          Prior Functioning/Environment Level of Independence: Independent        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    OT Diagnosis:     OT Problem List:     OT Treatment/Interventions:      OT Goals(Current goals can be found in the care plan section)    OT Frequency:     Barriers to D/C:            Co-evaluation              End of Session    Activity Tolerance: Patient tolerated treatment well (sats remained 93% to 95% on 02) Patient left: in chair;with call bell/phone within reach;with nursing/sitter in room  Time: 1610-9604 OT Time Calculation (min): 22 min Charges:  OT General Charges $OT Visit: 1 Procedure OT Evaluation $Initial OT Evaluation Tier I: 1 Procedure G-Codes:    Evern Bio 06/07/2015, 9:24 AM [phone]

## 2015-06-07 NOTE — Progress Notes (Signed)
[facility] PROGRESS NOTE  Bob Rodriguez:[id number] DOB: [birth year] DOA: 06/01/2015  PCP: Setup at [facility] on Aug 4.  Brief HPI: 44 year old Caucasian male with a past medical history of hypertension, presented with complains of lower extremity edema. In the emergency department he was noted to be hypoxic in the 80s. He was noted to desaturate while sleeping. He was placed on BiPAP. He was hospitalized for further management. He was changed over to CPAP. His swelling is improved with diuretics.   Past medical history:  Past Medical History  Diagnosis Date  . Hypertension     Consultants: Cardiology, pulmonology  Procedures:  2-D echocardiogram Study Conclusions - Left ventricle: The cavity size was mildly dilated. Wallthickness was increased in a pattern of mild LVH. Systolicfunction was mildly to moderately reduced. The estimated ejectionfraction was in the range of 40% to 45%. Images were inadequatefor LV wall motion assessment. Features are consistent with apseudonormal left ventricular filling pattern, with concomitantabnormal relaxation and increased filling pressure (grade 2diastolic dysfunction). - Aortic valve: There was no stenosis. - Mitral valve: There was no significant regurgitation. - Left atrium: The atrium was mildly dilated. - Right ventricle: The cavity size was moderately dilated. Systolicfunction was mildly reduced. - Right atrium: The atrium was mildly dilated. - Tricuspid valve: Poorly visualized. - Pulmonary arteries: No complete TR doppler jet so unable toestimate PA systolic pressure. Impressions: - Technically difficult study, images very poor even with Definity.Mildly dilated LV with mild LV hypertrophy. EF probably around40-45%, difficult to comment on individual wall segments.Moderately dilated RV with mildly decreased systolic function.  Lower extremity venous Dopplers Negative for DVT. Baker cyst noted on the  right.  Antibiotics: None  Subjective: Patient feels well this morning. Denies any shortness of breath or chest pain. Swelling is improving.    Objective: Vital Signs  Filed Vitals:   06/06/15 2353 06/07/15 0200 06/07/15 0400 06/07/15 0500  BP: 132/76  134/81   Pulse: 94  73   Temp: 98 F (36.7 C)  98.1 F (36.7 C)   TempSrc: Oral  Axillary   Resp: 30  14   Height:      Weight:    129.5 kg (285 lb 7.9 oz)  SpO2: 93% 94% 89%     Intake/Output Summary (Last 24 hours) at 06/07/15 0733 Last data filed at 06/06/15 2300  Gross per 24 hour  Intake      3 ml  Output   2275 ml  Net  -2272 ml   Filed Weights   06/05/15 0500 06/06/15 0544 06/07/15 0500  Weight: 129.9 kg (286 lb 6 oz) 127.3 kg (280 lb 10.3 oz) 129.5 kg (285 lb 7.9 oz)    General appearance: alert, cooperative, appears stated age, no distress and morbidly obese Resp: Improving air entry bilaterally. No crackles today. No wheezing. Cardio: regular rate and rhythm, S1, S2 normal, no murmur, click, rub or gallop GI: soft, non-tender; bowel sounds normal; no masses,  no organomegaly Extremities: 1+ edema bilateral lower extremities. improving. Neurologic: No focal deficits  Lab Results:  Basic Metabolic Panel:  Recent Labs Lab 06/01/15 1940 06/03/15 0239 06/04/15 0227 06/05/15 0208 06/07/15 0226  NA 145 143 140 139 138  K 4.3 4.5 4.5 4.2 4.4  CL 92* 97* 95* 96* 94*  CO2 36* 40* 38* 38* 32  GLUCOSE 110* 104* 89 82 126*  BUN 20 14 11 12  22*  CREATININE 0.86 0.88 0.90 0.80 0.75  CALCIUM 9.8 8.6* 8.6* 8.5* 9.1  Liver Function Tests:  Recent Labs Lab 06/01/15 1940  AST 40  ALT 43  ALKPHOS 79  BILITOT 0.8  PROT 6.2*  ALBUMIN 3.3*   CBC:  Recent Labs Lab 06/01/15 1940 06/04/15 0227 06/07/15 0226  WBC 12.4* 11.6* 19.8*  NEUTROABS 7.6  --   --   HGB 17.2* 17.8* 18.5*  HCT >55.0* 57.0* 57.5*  MCV 95.2 97.8 95.2  PLT 229 220 242   Cardiac Enzymes:  Recent Labs Lab 06/01/15 1940  06/02/15 0439 06/02/15 1150 06/02/15 1828  TROPONINI 0.07* 0.05* 0.05* 0.05*   BNP (last 3 results)  Recent Labs  06/01/15 1940  BNP 315.6*     Recent Results (from the past 240 hour(s))  MRSA PCR Screening     Status: None   Collection Time: 06/02/15  3:52 AM  Result Value Ref Range Status   MRSA by PCR NEGATIVE NEGATIVE Final    Comment:        The GeneXpert MRSA Assay (FDA approved for NASAL specimens only), is one component of a comprehensive MRSA colonization surveillance program. It is not intended to diagnose MRSA infection nor to guide or monitor treatment for MRSA infections.       Studies/Results: No results found.  Medications:  Scheduled: . antiseptic oral rinse  7 mL Mouth Rinse BID  . aspirin  325 mg Oral Daily  . azithromycin  250 mg Oral Daily  . furosemide  40 mg Intravenous BID  . heparin  5,000 Units Subcutaneous 3 times per day  . ipratropium-albuterol  3 mL Nebulization QID  . lisinopril  20 mg Oral Daily  . methylPREDNISolone (SOLU-MEDROL) injection  60 mg Intravenous Q6H  . nicotine  21 mg Transdermal Daily  . sodium chloride  3 mL Intravenous Q12H   Continuous:   Rodriguez:WRUEAV chloride, acetaminophen, calcium carbonate, hydrALAZINE, ondansetron (ZOFRAN) IV, sodium chloride, traMADol  Assessment/Plan:  Principal Problem:   Acute combined systolic and diastolic congestive heart failure Active Problems:   Hypertensive urgency   Acute CHF   Tobacco use disorder   Hypertension, uncontrolled   Acute respiratory failure with hypoxia and hypercarbia   OSA (obstructive sleep apnea)   COPD with acute exacerbation    Acute respiratory failure with hypoxia Multifactorial. Patient noted to have combined systolic and diastolic congestive heart failure. He also appears to have an element of obstructive sleep apnea and possibly even obesity hypoventilation syndrome. Patient seems to be stable. Still requiring high FiO2. Tolerating CPAP  overnight. Still with occasional episodes of hypoxia. Pulmonology is following. Continue nebulizer treatments and steroids along with antibiotics. Patient will need home oxygen along with CPAP. Will need outpatient sleep study.  Likely OHS/OSA Lifestyle changes discussed with the patient. Compliance with medication emphasized. See above.  Acute combined systolic and diastolic congestive heart failure with lower extremity edema Echocardiogram as above. Cardiology is following. Lasix is being continued. Patient's lost about 8 kg during this hospitalization. Continue diuretics. Continue to monitor ins and outs. Daily weights. TSH is normal.    Hypertensive urgency Blood pressure is improved. Continue current dose of lisinopril. Patient was on intravenous nitroglycerin initially, which was discontinued.   Mildly elevated troponin Likely secondary to demand ischemia. No plans for inpatient workup. Cardiac catheterization to be pursued as an outpatient. Aspirin.  Polycythemia Likely secondary due to smoking. Leukocytosis is secondary to steroids.  Tobacco abuse Nicotine patch  DVT Prophylaxis: Subcutaneous heparin    Code Status: Full code  Family Communication: Discussed with the patient  Disposition Plan: Will remain in step down for now. Start mobilizing. Will need case management assistance to set up home oxygen and CPAP.  Follow-up Appointment?: [facility] He will also need follow-up with pulmonology for sleep study.   LOS: 6 days   [facility]  [facility] Pager [phone] 06/07/2015, 7:33 AM  If 7PM-7AM, please contact night-coverage at [email or web address], password [facility]

## 2015-06-07 NOTE — Progress Notes (Signed)
Name: Bob Rodriguez MRN: [id number] DOB: 08-31-1971    ADMISSION DATE:  06/01/2015 CONSULTATION DATE:  06/07/2015  REFERRING MD :  TRH  CHIEF COMPLAINT:  Hypercarbia, dyspnea, prod cough  BRIEF PATIENT DESCRIPTION:  44 y.o.M 2PPD smoker with AECOPD newly dx , acute on chronic hypoxia/hypercarbia resp failure, Acute systolic HF new dx EF 45% HTN, does not see MDs regularly.  PCCM asked to assess resp failure and need for cpap/bipap.  SIGNIFICANT EVENTS  7/29 after tfr to floor from SDU, pt found sleeping and hypoxic diff to arouse and tfr back to SDU and hypercarbia on ABG and placed on bipap then to cpap  STUDIES:  Echo 7/29:  EF 45%   Hard to measure PAP   SUBJECTIVE:  No events overnight. Denies any chest pain or tightness. Patient reports improved quality of sleep on CPAP. RT had to do more education overnight with CPAP. Denies any audible wheezing. Cough improving with minimal mucus production. Dyspnea improving.  VITAL SIGNS: Temp:  [97.6 F (36.4 C)-98.1 F (36.7 C)] 97.6 F (36.4 C) (08/01 0739) Pulse Rate:  [73-110] 110 (08/01 0739) Resp:  [11-30] 19 (08/01 0739) BP: (125-151)/(61-98) 151/98 mmHg (08/01 0739) SpO2:  [89 %-95 %] 94 % (08/01 0800) Weight:  [285 lb 7.9 oz (129.5 kg)] 285 lb 7.9 oz (129.5 kg) (08/01 0500)  PHYSICAL EXAMINATION: General:  Obese male. No distress. Awake. Sitting up in chair watching TV. Neuro:  Moving all 4 extremities equally. CN 2-12 in tact. No meningismus. HEENT:  Moist membranes. No oral ulcers. No scleral icterus. Cardiovascular:  Regular Rate. Pitting edema bilateral lower extremities. Unable to appreciate JVD. Lungs:  Good aeration & clear to auscultation bilaterally. Normal work of breathing on oxygen. Abdomen:  Protuberant. Nontender. Normal bowel sounds. Musculoskeletal:  No joint effusion or deformity appreciated. Normal bulk and tone.   Recent Labs Lab 06/04/15 0227 06/05/15 0208 06/07/15 0226  NA 140 139 138  K 4.5  4.2 4.4  CL 95* 96* 94*  CO2 38* 38* 32  BUN 11 12 22*  CREATININE 0.90 0.80 0.75  GLUCOSE 89 82 126*    Recent Labs Lab 06/01/15 1940 06/04/15 0227 06/07/15 0226  HGB 17.2* 17.8* 18.5*  HCT >55.0* 57.0* 57.5*  WBC 12.4* 11.6* 19.8*  PLT 229 220 242   No results found.  ASSESSMENT / PLAN: Principal Problem:   Acute combined systolic and diastolic congestive heart failure Active Problems:   Hypertensive urgency   Acute CHF   Tobacco use disorder   Hypertension, uncontrolled   Acute respiratory failure with hypoxia and hypercarbia   OSA (obstructive sleep apnea)   COPD with acute exacerbation  1.  Acute hypoxic respiratory failure:  Secondary to combined systolic & diastolic CHF. Currently on Lasix for diuresis. Continuing to titrate FiO2 for Sat 90-94%. Continuing pulmonary toilet with IS.  2.  Acute on chronic hypercarbic respiratory failure:  Patient continuing on treatment for COPD with Duonebs & Steroids. Likely has some element of sleep disordered breathing. Continuing CPAP. Will need to be scheduled for outpt polysomnogram.  3)  Probable COPD exacerbation:  Finishing course of Azithromycin. Switching Solu-Medrol to Prednisone  daily. Needs a 2 week taper. Continuing Duonebs qid. Plan to obtain Full PFTs as an outpatient.  4)  Disposition:  Needs scheduled for outpatient polysomnogram & appointment in our office.  Donna Christen Jamison Neighbor, M.D. [facility] Pager:  [phone] After 3pm or if no response, call [phone]  06/07/2015, 8:52 AM

## 2015-06-07 NOTE — Progress Notes (Signed)
RT assisted patient with CPAP. RN called after RT left stating patient was trying to close exhalation port in mask. I had previously explained that he could not do this and why. She stated she would tell him again. RN called at later time saying patient was dropping Sats. RT advised her to increase O2 as need to maintain Sats. RT will monitor.

## 2015-06-07 NOTE — Plan of Care (Signed)
Problem: Phase II Progression Outcomes Goal: Begin discharge teaching Outcome: Completed/Met Date Met:  06/07/15 Discussed importance of Diet, Exercise, Smoking Cessation, and having a Sleep Study to assess his OSApnea.

## 2015-06-08 LAB — BASIC METABOLIC PANEL
ANION GAP: 7 (ref 5–15)
Anion gap: 8 (ref 5–15)
BUN: 28 mg/dL — AB (ref 6–20)
BUN: 28 mg/dL — ABNORMAL HIGH (ref 6–20)
CHLORIDE: 94 mmol/L — AB (ref 101–111)
CO2: 36 mmol/L — ABNORMAL HIGH (ref 22–32)
CO2: 36 mmol/L — ABNORMAL HIGH (ref 22–32)
Calcium: 8.8 mg/dL — ABNORMAL LOW (ref 8.9–10.3)
Calcium: 8.9 mg/dL (ref 8.9–10.3)
Chloride: 94 mmol/L — ABNORMAL LOW (ref 101–111)
Creatinine, Ser: 0.87 mg/dL (ref 0.61–1.24)
Creatinine, Ser: 0.82 mg/dL (ref 0.61–1.24)
GFR calc Af Amer: >60 mL/min (ref >60)
GFR calc Af Amer: >60 mL/min (ref >60)
GFR calc non Af Amer: >60 mL/min (ref >60)
Glucose, Bld: 157 mg/dL — ABNORMAL HIGH (ref 65–99)
Glucose, Bld: 136 mg/dL — ABNORMAL HIGH (ref 65–99)
POTASSIUM: 4.2 mmol/L (ref 3.5–5.1)
Potassium: 4 mmol/L (ref 3.5–5.1)
SODIUM: 138 mmol/L (ref 135–145)
Sodium: 137 mmol/L (ref 135–145)

## 2015-06-08 LAB — CBC
HCT: 57 % — ABNORMAL HIGH (ref 39.0–52.0)
HEMOGLOBIN: 18 g/dL — AB (ref 13.0–17.0)
MCH: 30.4 pg (ref 26.0–34.0)
MCHC: 31.6 g/dL (ref 30.0–36.0)
MCV: 96.1 fL (ref 78.0–100.0)
PLATELETS: 223 10*3/uL (ref 150–400)
RBC: 5.93 MIL/uL — ABNORMAL HIGH (ref 4.22–5.81)
RDW: 15 % (ref 11.5–15.5)
WBC: 16.3 10*3/uL — AB (ref 4.0–10.5)

## 2015-06-08 LAB — MAGNESIUM: Magnesium: 2.3 mg/dL (ref 1.7–2.4)

## 2015-06-08 MED ORDER — IPRATROPIUM-ALBUTEROL 0.5-2.5 (3) MG/3ML IN SOLN
3.0000 mL | Freq: Two times a day (BID) | RESPIRATORY_TRACT | Status: DC
  Administered 2015-06-08 – 2015-06-11 (×6): 3 mL via RESPIRATORY_TRACT
  Filled 2015-06-08 (×6): qty 3

## 2015-06-08 MED ORDER — BISOPROLOL FUMARATE 5 MG PO TABS
5.0000 mg | ORAL_TABLET | Freq: Every day | ORAL | Status: DC
  Administered 2015-06-08 – 2015-06-10 (×3): 5 mg via ORAL
  Filled 2015-06-08 (×3): qty 1

## 2015-06-08 NOTE — Progress Notes (Signed)
Patient ID: Bob Rodriguez, male   DOB: [birth year], 44 y.o.   MRN: [id number]   SUBJECTIVE:  Hypercarbic respiratory failure with CO2 70  Good diuresis (18 liters) less dyspnea. Less SOB.   OBJECTIVE:   Vitals:   Filed Vitals:   06/08/15 0530 06/08/15 0745 06/08/15 0808 06/08/15 1232  BP:  150/91  131/93  Pulse:  94  86  Temp:  98.5 F (36.9 C)  98.2 F (36.8 C)  TempSrc:  Oral  Oral  Resp:  19  20  Height:      Weight: 278 lb (126.1 kg)     SpO2:  99% 98% 96%   I&O's:    Intake/Output Summary (Last 24 hours) at 06/08/15 1314 Last data filed at 06/08/15 1232  Gross per 24 hour  Intake   1820 ml  Output   4100 ml  Net  -2280 ml   TELEMETRY: Reviewed telemetry pt in normal sinus rhythm:06/08/2015   Echo:  7/28 Impressions:  - Technically difficult study, images very poor even with Definity. Mildly dilated LV with mild LV hypertrophy. EF probably around 40-45%, difficult to comment on individual wall segments. Moderately dilated RV with mildly decreased systolic function.   PHYSICAL EXAM General: Obese male  Head:   Normal cephalic and atramatic  Lungs:   Clear bilaterally to auscultation. No wheeze currently Heart:   HRRR S1 S2  No JVD.   Abdomen: abdomen soft and non-tender Msk:  Back normal,  Normal strength and tone for age. Extremities:   2+ pitting ankle/LE  edema.   Neuro: Alert and oriented. Psych:  Normal affect, responds appropriately Skin: No rash   LABS: Basic Metabolic Panel:  Recent Labs  40/98/11 2245 06/08/15 0223  NA 137 138  K 4.2 4.0  CL 94* 94*  CO2 36* 36*  GLUCOSE 157* 136*  BUN 28* 28*  CREATININE 0.87 0.82  CALCIUM 8.8* 8.9  MG 2.3  --    CBC:  Recent Labs  06/07/15 0226 06/08/15 0223  WBC 19.8* 16.3*  HGB 18.5* 18.0*  HCT 57.5* 57.0*  MCV 95.2 96.1  PLT 242 223   Cardiac Enzymes: No results for input(s): CKTOTAL, CKMB, CKMBINDEX, TROPONINI in the last 72 hours.  RADIOLOGY: Dg Chest Port 1 View  06/04/2015    CLINICAL DATA:  Lethargic.  Low O2 sats.  Hypertension, smoker.  EXAM: PORTABLE CHEST - 1 VIEW  COMPARISON:  06/01/2015  FINDINGS: Cardiomegaly. Mild peribronchial thickening and interstitial prominence is stable since prior study, likely chronic bronchitis. No acute airspace opacities, effusions or edema. No acute bony abnormality.  IMPRESSION: Cardiomegaly.  Stable peribronchial thickening and interstitial prominence, likely chronic bronchitis.   Electronically Signed   By: Charlett Nose M.D.   On: 06/04/2015 18:05   Dg Chest Port 1 View  06/01/2015   CLINICAL DATA:  Hypoxia for 6 months. Bilateral lower extremity swelling today.  EXAM: PORTABLE CHEST - 1 VIEW  COMPARISON:  None.  FINDINGS: There is cardiomegaly and pulmonary vascular congestion. No consolidative process, pneumothorax or effusion.  IMPRESSION: Cardiomegaly without acute disease.   Electronically Signed   By: Drusilla Kanner M.D.   On: 06/01/2015 20:16   Scheduled Meds: . antiseptic oral rinse  7 mL Mouth Rinse BID  . aspirin  325 mg Oral Daily  . azithromycin  250 mg Oral Daily  . bisoprolol  5 mg Oral Daily  . furosemide  40 mg Intravenous BID  . heparin  5,000 Units Subcutaneous 3 times  per day  . ipratropium-albuterol  3 mL Nebulization BID  . lisinopril  20 mg Oral Daily  . nicotine  21 mg Transdermal Daily  . predniSONE  60 mg Oral Q breakfast  . sodium chloride  3 mL Intravenous Q12H   Continuous Infusions:  PRN Meds:.sodium chloride, acetaminophen, calcium carbonate, hydrALAZINE, ondansetron (ZOFRAN) IV, sodium chloride, traMADol    ASSESSMENT: Bob Rodriguez:    44 year old smoker with hypercarbic respiratory failure from multiple sources, acute systolic heart failure, COPD, obesity.  1. Acute systolic heart failure  - 18 liters out total  - more to go (LE edema noted), mild JVD  - continue IV lasix 40 BID (want to further optimize)  - creat stable  - on ACE-I lisinopril  - adding bisoprolol 5mg  PO QD.  - EF 40%,  prior NSVT  2. Cardiomyopathy  -  Outpatient f/u Dr Eldridge Dace will likely need right and left heart cath after on good medical Rx and home CPAP  3. Acute hypercarbic respiratory failure  - per pulmonary  - leukocytosis in part from prednisone, improving  - nebs  4. Tobacco use  - wearing patch. Has not wanted cig. Encourage cessation.  Jake Bathe, MD  06/08/2015  1:14 PM

## 2015-06-08 NOTE — Progress Notes (Signed)
Patient placed on cpap  

## 2015-06-08 NOTE — Progress Notes (Signed)
RT has made two attempts to place patient on CPAP. Patient stated he would have RN contact RT when he was ready.

## 2015-06-08 NOTE — Progress Notes (Signed)
Patient  had 9 beats of ventricular tachycardia returning to sinus rhythm .Asymptomatic blood pressure 144/77.Call placed to NP Bob Rodriguez .Order received for lab work and call abnormal values.

## 2015-06-08 NOTE — Progress Notes (Signed)
Name: Bob Rodriguez MRN: [id number] DOB: [birth year]    ADMISSION DATE:  06/01/2015 CONSULTATION DATE:  06/08/2015  REFERRING MD :  TRH  CHIEF COMPLAINT:  Hypercarbia, dyspnea, prod cough  BRIEF PATIENT DESCRIPTION:  44 y.o.M 2PPD smoker with AECOPD newly dx , acute on chronic hypoxia/hypercarbia resp failure, Acute systolic HF new dx EF 45% HTN, does not see MDs regularly.  PCCM asked to assess resp failure and need for cpap/bipap.  SIGNIFICANT EVENTS  7/29 after tfr to floor from SDU, pt found sleeping and hypoxic diff to arouse and tfr back to SDU and hypercarbia on ABG and placed on bipap then to cpap  STUDIES:  Echo 7/29:  EF 45%   Hard to measure PAP   SUBJECTIVE:  No events overnight. He remains chest pain and chest pressure free. Reports dyspnea continues to improve significantly. Denies any productive cough. Denies any subjective fever, chills, or sweats. He has not yet been ambulating in hallway.  ROS: Denies any abdominal pain, nausea, or vomiting. No headache or vision changes.  VITAL SIGNS: Temp:  [97.8 F (36.6 C)-98.5 F (36.9 C)] 98.5 F (36.9 C) (08/02 0745) Pulse Rate:  [83-107] 94 (08/02 0745) Resp:  [11-22] 19 (08/02 0745) BP: (132-161)/(59-103) 150/91 mmHg (08/02 0745) SpO2:  [91 %-99 %] 99 % (08/02 0745) Weight:  [278 lb (126.1 kg)] 278 lb (126.1 kg) (08/02 0530)  PHYSICAL EXAMINATION: General:  Obese male. No distress. Sitting up in chair watching TV drinking coffee. Neuro:  Moving all 4 extremities equally. CN 2-12 in tact. No meningismus. HEENT:  Moist membranes. No oral ulcers. No scleral injection. Cardiovascular:  Regular Rate. Pitting edema bilateral lower extremities relatively unchanged. Unable to appreciate JVD. Lungs:  Good aeration & clear to auscultation bilaterally even in the bases. Normal work of breathing on supplemental oxygen. Abdomen:  Protuberant. Nontender. Normal bowel sounds. Musculoskeletal:  No joint effusion or deformity  appreciated. Normal bulk and tone.   Recent Labs Lab 06/07/15 0226 06/07/15 2245 06/08/15 0223  NA 138 137 138  K 4.4 4.2 4.0  CL 94* 94* 94*  CO2 32 36* 36*  BUN 22* 28* 28*  CREATININE 0.75 0.87 0.82  GLUCOSE 126* 157* 136*    Recent Labs Lab 06/04/15 0227 06/07/15 0226 06/08/15 0223  HGB 17.8* 18.5* 18.0*  HCT 57.0* 57.5* 57.0*  WBC 11.6* 19.8* 16.3*  PLT 220 242 223   No results found.  ASSESSMENT / PLAN: 1.  Acute hypoxic respiratory failure:  Secondary to combined systolic & diastolic CHF. Currently on Lasix for diuresis. Continuing to titrate FiO2 for Sat 90-94%. Continuing pulmonary toilet with IS and recommend mobilization in the hallway.  2.  Acute on chronic hypercarbic respiratory failure:  Patient continuing on treatment for COPD with Duonebs & Steroids. Likely has some element of sleep disordered breathing. Continuing CPAP. Will need to be scheduled for outpatient polysomnogram.  3)  Probable COPD exacerbation:  Finishing course of Azithromycin. Continuing prednisone for a two-week taper. Continuing Duonebs qid. Plan to obtain Full PFTs as an outpatient.  4)  Disposition:  Patient has been scheduled for a follow-up appointment with me in our pulmonary clinic on 8/17 at 2:30 PM. A polysomnogram should be ordered prior to discharge from hospital.  At this time, I will sign off. Please let me know if I can be of any further assistance in the care of this patient.  Donna Christen Jamison Neighbor, M.D. [facility] Pager:  [phone] After 3pm or  if no response, call [phone]  06/08/2015, 8:06 AM

## 2015-06-08 NOTE — Progress Notes (Signed)
Faxed sleep study form to [facility]. They will call pt to set up appt.

## 2015-06-08 NOTE — Progress Notes (Signed)
[facility] PROGRESS NOTE  MAISON KESTENBAUM ZOX:[id number] DOB: 1971/09/08 DOA: 06/01/2015  PCP: Setup at [facility] on Aug 4.  Brief HPI: 44 year old Caucasian male with a past medical history of hypertension, presented with complains of lower extremity edema. In the emergency department he was noted to be hypoxic in the 80s. He was noted to desaturate while sleeping. He was placed on BiPAP. He was hospitalized for further management. He was changed over to CPAP. His swelling is improved with diuretics.   Past medical history:  Past Medical History  Diagnosis Date  . Hypertension     Consultants: Cardiology, pulmonology  Procedures:  2-D echocardiogram Study Conclusions - Left ventricle: The cavity size was mildly dilated. Wallthickness was increased in a pattern of mild LVH. Systolicfunction was mildly to moderately reduced. The estimated ejectionfraction was in the range of 40% to 45%. Images were inadequatefor LV wall motion assessment. Features are consistent with apseudonormal left ventricular filling pattern, with concomitantabnormal relaxation and increased filling pressure (grade 2diastolic dysfunction). - Aortic valve: There was no stenosis. - Mitral valve: There was no significant regurgitation. - Left atrium: The atrium was mildly dilated. - Right ventricle: The cavity size was moderately dilated. Systolicfunction was mildly reduced. - Right atrium: The atrium was mildly dilated. - Tricuspid valve: Poorly visualized. - Pulmonary arteries: No complete TR doppler jet so unable toestimate PA systolic pressure. Impressions: - Technically difficult study, images very poor even with Definity.Mildly dilated LV with mild LV hypertrophy. EF probably around40-45%, difficult to comment on individual wall segments.Moderately dilated RV with mildly decreased systolic function.  Lower extremity venous Dopplers Negative for DVT. Baker cyst noted on the  right.  Antibiotics: None  Subjective: Patient denies any complaints this morning. According to the nurse he didn't use CPAP most of the night.   Objective: Vital Signs  Filed Vitals:   06/07/15 2134 06/07/15 2314 06/08/15 0358 06/08/15 0530  BP: 140/77 152/91 149/103   Pulse: 94 107 90   Temp:  98 F (36.7 C) 97.8 F (36.6 C)   TempSrc:  Oral Oral   Resp: Height:      Weight:    126.1 kg (278 lb)  SpO2: 97% 91% 98%     Intake/Output Summary (Last 24 hours) at 06/08/15 0731 Last data filed at 06/08/15 0539  Gross per 24 hour  Intake   1343 ml  Output   2900 ml  Net  -1557 ml   Filed Weights   06/06/15 0544 06/07/15 0500 06/08/15 0530  Weight: 127.3 kg (280 lb 10.3 oz) 129.5 kg (285 lb 7.9 oz) 126.1 kg (278 lb)    General appearance: alert, cooperative, appears stated age, no distress and morbidly obese Resp: Improving air entry bilaterally. No crackles. No wheezing. Cardio: regular rate and rhythm, S1, S2 normal, no murmur, click, rub or gallop GI: soft, non-tender; bowel sounds normal; no masses,  no organomegaly Extremities: 1+ edema bilateral lower extremities. improving. Neurologic: No focal deficits  Lab Results:  Basic Metabolic Panel:  Recent Labs Lab 06/04/15 0227 06/05/15 0208 06/07/15 0226 06/07/15 2245 06/08/15 0223  NA 140 139 138 137 138  K 4.5 4.2 4.4 4.2 4.0  CL 95* 96* 94* 94* 94*  CO2 38* 38* 32 36* 36*  GLUCOSE 89 82 126* 157* 136*  BUN 11 12 22* 28* 28*  CREATININE 0.90 0.80 0.75 0.87 0.82  CALCIUM 8.6* 8.5* 9.1 8.8* 8.9  MG  --   --   --  2.3  --    Liver Function Tests:  Recent Labs Lab 06/01/15 1940  AST 40  ALT 43  ALKPHOS 79  BILITOT 0.8  PROT 6.2*  ALBUMIN 3.3*   CBC:  Recent Labs Lab 06/01/15 1940 06/04/15 0227 06/07/15 0226 06/08/15 0223  WBC 12.4* 11.6* 19.8* 16.3*  NEUTROABS 7.6  --   --   --   HGB 17.2* 17.8* 18.5* 18.0*  HCT >55.0* 57.0* 57.5* 57.0*  MCV 95.2 97.8 95.2 96.1  PLT 229 220  242 223   Cardiac Enzymes:  Recent Labs Lab 06/01/15 1940 06/02/15 0439 06/02/15 1150 06/02/15 1828  TROPONINI 0.07* 0.05* 0.05* 0.05*   BNP (last 3 results)  Recent Labs  06/01/15 1940  BNP 315.6*     Recent Results (from the past 240 hour(s))  MRSA PCR Screening     Status: None   Collection Time: 06/02/15  3:52 AM  Result Value Ref Range Status   MRSA by PCR NEGATIVE NEGATIVE Final    Comment:        The GeneXpert MRSA Assay (FDA approved for NASAL specimens only), is one component of a comprehensive MRSA colonization surveillance program. It is not intended to diagnose MRSA infection nor to guide or monitor treatment for MRSA infections.       Studies/Results: No results found.  Medications:  Scheduled: . antiseptic oral rinse  7 mL Mouth Rinse BID  . aspirin  325 mg Oral Daily  . azithromycin  250 mg Oral Daily  . furosemide  40 mg Intravenous BID  . heparin  5,000 Units Subcutaneous 3 times per day  . ipratropium-albuterol  3 mL Nebulization QID  . lisinopril  20 mg Oral Daily  . nicotine  21 mg Transdermal Daily  . predniSONE  60 mg Oral Q breakfast  . sodium chloride  3 mL Intravenous Q12H   Continuous:   WJX:BJYNWG chloride, acetaminophen, calcium carbonate, hydrALAZINE, ondansetron (ZOFRAN) IV, sodium chloride, traMADol  Assessment/Plan:  Principal Problem:   Acute combined systolic and diastolic congestive heart failure Active Problems:   Hypertensive urgency   Acute CHF   Tobacco use disorder   Hypertension, uncontrolled   Acute respiratory failure with hypoxia and hypercarbia   OSA (obstructive sleep apnea)   COPD with acute exacerbation    Acute respiratory failure with hypoxia Etiology is multifactorial. Patient noted to have combined systolic and diastolic congestive heart failure. He also appears to have an element of obstructive sleep apnea and possibly even obesity hypoventilation syndrome. Patient seems to be stable.  Still requiring up to 4 L of oxygen by nasal Route. He was on 6 L yesterday. According to nurse, he does tend to desat, especially at nighttime when he sleeping even with the CPAP. Tolerating CPAP overnight. Pulmonology is following. Continue nebulizer treatments and steroids along with antibiotics. Patient will need home oxygen along with CPAP. Will need outpatient sleep study. The referral form has been signed. His compliance with CPAP will be questionable.  Likely OHS/OSA Lifestyle changes discussed with the patient. Compliance with medication emphasized. See above.  Acute combined systolic and diastolic congestive heart failure with lower extremity edema Echocardiogram as above. Cardiology is following. Lasix is being continued. Patient's lost about 8-9 kg during this hospitalization. Continue diuretics. Might be transitioned to oral Lasix today. Continue to monitor ins and outs. Daily weights. TSH is normal. A run of nonsustained V tach was noted on telemetry. Consider beta blocker. Will defer to cardiology.  Hypertensive urgency Blood pressure  is improved. Continue current dose of lisinopril. Patient was on intravenous nitroglycerin initially, which was discontinued.   Mildly elevated troponin Likely secondary to demand ischemia. No plans for inpatient workup. Cardiac catheterization to be pursued as an outpatient. Aspirin.  Polycythemia Likely secondary due to smoking. Leukocytosis is secondary to steroids.  Tobacco abuse Nicotine patch  DVT Prophylaxis: Subcutaneous heparin    Code Status: Full code  Family Communication: Discussed with the patient  Disposition Plan: Continue mobilizing. Continue titrating FiO2's. Hopefully he can be transferred out of the stepdown unit soon. He will need to be set up with home oxygen and home CPAP machine.   Follow-up Appointment?: [facility] He will also need follow-up with pulmonology for sleep  study.   LOS: 7 days   [facility]  [facility] Pager [phone] 06/08/2015, 7:31 AM  If 7PM-7AM, please contact night-coverage at [email or web address], password [facility]

## 2015-06-08 NOTE — Progress Notes (Signed)
Patient asking to come off cpap and be placed back on nasal canula at 4 liters.

## 2015-06-09 DIAGNOSIS — R601 Generalized edema: Secondary | ICD-10-CM | POA: Insufficient documentation

## 2015-06-09 DIAGNOSIS — R0902 Hypoxemia: Secondary | ICD-10-CM

## 2015-06-09 LAB — BASIC METABOLIC PANEL
ANION GAP: 11 (ref 5–15)
BUN: 26 mg/dL — AB (ref 6–20)
CHLORIDE: 93 mmol/L — AB (ref 101–111)
CO2: 36 mmol/L — AB (ref 22–32)
Calcium: 9.6 mg/dL (ref 8.9–10.3)
Creatinine, Ser: 0.84 mg/dL (ref 0.61–1.24)
GFR calc non Af Amer: >60 mL/min (ref >60)
Glucose, Bld: 149 mg/dL — ABNORMAL HIGH (ref 65–99)
Potassium: 4.5 mmol/L (ref 3.5–5.1)
Sodium: 140 mmol/L (ref 135–145)

## 2015-06-09 NOTE — Progress Notes (Signed)
[facility] PROGRESS NOTE  Bob Rodriguez RUE:[id number] DOB: 16-Oct-1971 DOA: 06/01/2015 PCP: No primary care provider on file.  Assessment/Plan: Acute respiratory failure with hypoxia Etiology is multifactorial. Patient noted to have hx of combined systolic and diastolic congestive heart failure. He also appears to have an element of obstructive sleep apnea and possibly even obesity hypoventilation syndrome. Continue nebulizer treatments and steroids along with empiric antibiotics. Ultimately, patient will need home oxygen along with CPAP. Will benefit from outpatient sleep study. The referral form has been signed. His compliance with CPAP will be questionable.  Likely OHS/OSA Lifestyle changes discussed with the patient. Compliance with medication emphasized. See above.  Acute combined systolic and diastolic congestive heart failure with lower extremity edema Echocardiogram as above. Cardiology is following. Lasix is being continued. Continue diuretics. Might be transitioned to oral Lasix today. Continue to monitor ins and outs. Daily weights. TSH is normal. A run of nonsustained V tach was noted on telemetry. Consider beta blocker. Will defer to cardiology. -Overall improving but with continued signs of volume overload -thus far net neg 22.3L  Hypertensive urgency Blood pressure is improved. Continue current dose of lisinopril. Patient was on intravenous nitroglycerin initially, which was discontinued.   Mildly elevated troponin Likely secondary to demand ischemia. No plans for inpatient workup. Cardiac catheterization to be pursued as an outpatient. Aspirin continued.  Polycythemia Likely secondary due to smoking. Leukocytosis is secondary to steroids.  Tobacco abuse Nicotine patch  Code Status: Full Family Communication: Pt in room (indicate person spoken with, relationship, and if by phone, the number) Disposition Plan:  Pending   Consultants:  Cardiology  Procedures:    Antibiotics:  Azithromycin 7/31>>>  HPI/Subjective: Feels better today. Denies chest pain  Objective: Filed Vitals:   06/09/15 0500 06/09/15 0828 06/09/15 0900 06/09/15 1243  BP:  140/85  125/78  Pulse:  85  75  Temp:  97.6 F (36.4 C)  98.1 F (36.7 C)  TempSrc:  Oral  Oral  Resp:  19  21  Height:      Weight: 124.7 kg (274 lb 14.6 oz)     SpO2:  93% 97% 92%    Intake/Output Summary (Last 24 hours) at 06/09/15 1539 Last data filed at 06/09/15 1525  Gross per 24 hour  Intake    240 ml  Output   3750 ml  Net  -3510 ml   Filed Weights   06/07/15 0500 06/08/15 0530 06/09/15 0500  Weight: 129.5 kg (285 lb 7.9 oz) 126.1 kg (278 lb) 124.7 kg (274 lb 14.6 oz)    Exam:   General:  Awake, in nad  Cardiovascular: regular, s1,s 2  Respiratory: normal resp effort, no wheezing  Abdomen: soft,obese, nondistended  Musculoskeletal: perfused, no clubbing   Data Reviewed: Basic Metabolic Panel:  Recent Labs Lab 06/05/15 0208 06/07/15 0226 06/07/15 2245 06/08/15 0223 06/09/15 1315  NA 139 138 137 138 140  K 4.2 4.4 4.2 4.0 4.5  CL 96* 94* 94* 94* 93*  CO2 38* 32 36* 36* 36*  GLUCOSE 82 126* 157* 136* 149*  BUN 12 22* 28* 28* 26*  CREATININE 0.80 0.75 0.87 0.82 0.84  CALCIUM 8.5* 9.1 8.8* 8.9 9.6  MG  --   --  2.3  --   --    Liver Function Tests: No results for input(s): AST, ALT, ALKPHOS, BILITOT, PROT, ALBUMIN in the last 168 hours. No results for input(s): LIPASE, AMYLASE in the last 168 hours. No results for input(s): AMMONIA in the  last 168 hours. CBC:  Recent Labs Lab 06/04/15 0227 06/07/15 0226 06/08/15 0223  WBC 11.6* 19.8* 16.3*  HGB 17.8* 18.5* 18.0*  HCT 57.0* 57.5* 57.0*  MCV 97.8 95.2 96.1  PLT 220 242 223   Cardiac Enzymes:  Recent Labs Lab 06/02/15 1828  TROPONINI 0.05*   BNP (last 3 results)  Recent Labs  06/01/15 1940  BNP 315.6*    ProBNP (last 3 results) No  results for input(s): PROBNP in the last 8760 hours.  CBG: No results for input(s): GLUCAP in the last 168 hours.  Recent Results (from the past 240 hour(s))  MRSA PCR Screening     Status: None   Collection Time: 06/02/15  3:52 AM  Result Value Ref Range Status   MRSA by PCR NEGATIVE NEGATIVE Final    Comment:        The GeneXpert MRSA Assay (FDA approved for NASAL specimens only), is one component of a comprehensive MRSA colonization surveillance program. It is not intended to diagnose MRSA infection nor to guide or monitor treatment for MRSA infections.      Studies: No results found.  Scheduled Meds: . antiseptic oral rinse  7 mL Mouth Rinse BID  . aspirin  325 mg Oral Daily  . bisoprolol  5 mg Oral Daily  . furosemide  40 mg Intravenous BID  . heparin  5,000 Units Subcutaneous 3 times per day  . ipratropium-albuterol  3 mL Nebulization BID  . lisinopril  20 mg Oral Daily  . nicotine  21 mg Transdermal Daily  . predniSONE  60 mg Oral Q breakfast  . sodium chloride  3 mL Intravenous Q12H   Continuous Infusions:   Principal Problem:   Acute combined systolic and diastolic congestive heart failure Active Problems:   Hypertensive urgency   Acute CHF   Tobacco use disorder   Hypertension, uncontrolled   Acute respiratory failure with hypoxia and hypercarbia   OSA (obstructive sleep apnea)   COPD with acute exacerbation    Bob Rodriguez K  [facility] Pager [phone]. If 7PM-7AM, please contact night-coverage at [email or web address], password [facility] 06/09/2015, 3:39 PM  LOS: 8 days

## 2015-06-09 NOTE — Procedures (Signed)
Second visit to pt room.  Pt is not ready to be placed on CPAP machine.  Pt will notified RT when he is ready.

## 2015-06-09 NOTE — Progress Notes (Signed)
Pt removed cpap, placed on 3L O2. Up to chair.

## 2015-06-09 NOTE — Progress Notes (Signed)
Pt placed on cpap, back to bed from chair

## 2015-06-09 NOTE — Progress Notes (Signed)
    Subjective: Feeling better.  Still has LEE  Objective: Vital signs in last 24 hours: Temp:  [97.6 F (36.4 C)-98.5 F (36.9 C)] 97.6 F (36.4 C) (08/03 0828) Pulse Rate:  [64-86] 85 (08/03 0828) Resp:  [15-20] 19 (08/03 0828) BP: (125-142)/(64-93) 140/85 mmHg (08/03 0828) SpO2:  [92 %-99 %] 97 % (08/03 0900) Weight:  [274 lb 14.6 oz (124.7 kg)] 274 lb 14.6 oz (124.7 kg) (08/03 0500) Last BM Date: 06/08/15  Intake/Output from previous day: 08/02 0701 - 08/03 0700 In: 1200 [P.O.:1200] Out: 3500 [Urine:3500] Intake/Output this shift: Total I/O In: 240 [P.O.:240] Out: 1850 [Urine:1850]  Medications Scheduled Meds: . antiseptic oral rinse  7 mL Mouth Rinse BID  . aspirin  325 mg Oral Daily  . bisoprolol  5 mg Oral Daily  . furosemide  40 mg Intravenous BID  . heparin  5,000 Units Subcutaneous 3 times per day  . ipratropium-albuterol  3 mL Nebulization BID  . lisinopril  20 mg Oral Daily  . nicotine  21 mg Transdermal Daily  . predniSONE  60 mg Oral Q breakfast  . sodium chloride  3 mL Intravenous Q12H   Continuous Infusions:  PRN Meds:.sodium chloride, acetaminophen, calcium carbonate, hydrALAZINE, ondansetron (ZOFRAN) IV, sodium chloride, traMADol  PE: General appearance: alert and cooperative Neck: JVD looks elevated still Lungs: mild crackles Heart: regular rate and rhythm, S1, S2 normal, no murmur, click, rub or gallop Extremities: 2+ R>L LEE Pulses: 2+ and symmetric Skin: Warm and dry Neurologic: Grossly normal  Lab Results:   Recent Labs  06/07/15 0226 06/08/15 0223  WBC 19.8* 16.3*  HGB 18.5* 18.0*  HCT 57.5* 57.0*  PLT 242 223   BMET  Recent Labs  06/07/15 0226 06/07/15 2245 06/08/15 0223  NA 138 137 138  K 4.4 4.2 4.0  CL 94* 94* 94*  CO2 32 36* 36*  GLUCOSE 126* 157* 136*  BUN 22* 28* 28*  CREATININE 0.75 0.87 0.82  CALCIUM 9.1 8.8* 8.9    Assessment/Plan   1. Acute systolic heart failure - Net fluids: -2.3L/-20.4L - more  to go (LE edema noted), mild JVD - continue IV lasix 40 BID - I reordered daily BMETs .  - on ACE-I lisinopril - bisoprolol  PO QD added yesterday. - EF 40%, prior NSVT  2. Cardiomyopathy - Outpatient f/u Dr Eldridge Dace will likely need right and left heart cath after on good medical Rx and home CPAP  3. Acute hypercarbic respiratory failure - per pulmonary - leukocytosis in part from prednisone, improving - nebs  4. Tobacco use - wearing patch. Has not wanted cig. Encourage cessation.  5.  Obesity  We had a long discussion about healthy eating and exercise.  I will refer to an outpatient RD.   LOS: 8 days    HAGER, BRYAN PA-C 06/09/2015 11:20 AM  Personally seen and examined. Agree with above. Optimally continue IV lasix.  LE edema noted.  21 liters out. Awaiting BMET Donato Schultz, MD

## 2015-06-10 ENCOUNTER — Inpatient Hospital Stay: Payer: Self-pay | Admitting: Internal Medicine

## 2015-06-10 DIAGNOSIS — G4733 Obstructive sleep apnea (adult) (pediatric): Secondary | ICD-10-CM

## 2015-06-10 DIAGNOSIS — Z91148 Patient's other noncompliance with medication regimen for other reason: Secondary | ICD-10-CM | POA: Insufficient documentation

## 2015-06-10 DIAGNOSIS — Z9114 Patient's other noncompliance with medication regimen: Secondary | ICD-10-CM | POA: Insufficient documentation

## 2015-06-10 DIAGNOSIS — R5383 Other fatigue: Secondary | ICD-10-CM

## 2015-06-10 DIAGNOSIS — R601 Generalized edema: Secondary | ICD-10-CM

## 2015-06-10 DIAGNOSIS — I161 Hypertensive emergency: Secondary | ICD-10-CM | POA: Insufficient documentation

## 2015-06-10 LAB — BASIC METABOLIC PANEL
ANION GAP: 7 (ref 5–15)
BUN: 26 mg/dL — AB (ref 6–20)
CO2: 40 mmol/L — ABNORMAL HIGH (ref 22–32)
CREATININE: 0.9 mg/dL (ref 0.61–1.24)
Calcium: 9.2 mg/dL (ref 8.9–10.3)
Chloride: 94 mmol/L — ABNORMAL LOW (ref 101–111)
GFR calc Af Amer: >60 mL/min (ref >60)
GFR calc non Af Amer: >60 mL/min (ref >60)
Glucose, Bld: 92 mg/dL (ref 65–99)
Potassium: 3.7 mmol/L (ref 3.5–5.1)
Sodium: 141 mmol/L (ref 135–145)

## 2015-06-10 MED ORDER — FUROSEMIDE 40 MG PO TABS
40.0000 mg | ORAL_TABLET | Freq: Two times a day (BID) | ORAL | Status: DC
  Filled 2015-06-10 (×3): qty 1

## 2015-06-10 MED ORDER — FUROSEMIDE 40 MG PO TABS
40.0000 mg | ORAL_TABLET | Freq: Two times a day (BID) | ORAL | Status: DC
  Administered 2015-06-10 – 2015-06-11 (×2): 40 mg via ORAL
  Filled 2015-06-10 (×4): qty 1

## 2015-06-10 MED ORDER — POTASSIUM CHLORIDE CRYS ER 20 MEQ PO TBCR
40.0000 meq | EXTENDED_RELEASE_TABLET | Freq: Once | ORAL | Status: AC
  Administered 2015-06-10: 40 meq via ORAL
  Filled 2015-06-10: qty 2

## 2015-06-10 MED ORDER — BISOPROLOL FUMARATE 10 MG PO TABS
10.0000 mg | ORAL_TABLET | Freq: Every day | ORAL | Status: DC
  Administered 2015-06-11: 10 mg via ORAL
  Filled 2015-06-10: qty 1

## 2015-06-10 NOTE — Progress Notes (Signed)
   06/10/15 0300  BiPAP/CPAP/SIPAP  BiPAP/CPAP/SIPAP Pt Type Adult  Mask Type Full face mask  Mask Size Large  Set Rate 0 breaths/min  Respiratory Rate 18 breaths/min  IPAP 10 cmH20  EPAP 10 cmH2O  Flow Rate 3 lpm  BiPAP/CPAP/SIPAP CPAP  Patient Home Equipment No  Auto Titrate No  Patient said the CPAP pressure was too much. I decreased it from 12 to 10. He is tolerating it very well at this time.

## 2015-06-10 NOTE — Progress Notes (Signed)
Patient Name: Bob Rodriguez Date of Encounter: 06/10/2015   SUBJECTIVE  Feeling better. Denies chest pain or palpitation. Stable SOB. Slept well on CPAP.   CURRENT MEDS . antiseptic oral rinse  7 mL Mouth Rinse BID  . aspirin  325 mg Oral Daily  . bisoprolol  5 mg Oral Daily  . furosemide  40 mg Intravenous BID  . heparin  5,000 Units Subcutaneous 3 times per day  . ipratropium-albuterol  3 mL Nebulization BID  . lisinopril  20 mg Oral Daily  . nicotine  21 mg Transdermal Daily  . predniSONE  60 mg Oral Q breakfast  . sodium chloride  3 mL Intravenous Q12H    OBJECTIVE  Filed Vitals:   06/09/15 1546 06/09/15 2239 06/10/15 0610 06/10/15 0648  BP: 135/73 155/92 157/100   Pulse: 71 68 66   Temp: 98.4 F (36.9 C) 97.7 F (36.5 C) 97.9 F (36.6 C)   TempSrc: Oral Oral Oral   Resp: 18 81    Height:      Weight:    269 lb 3.2 oz (122.108 kg)  SpO2: 95% 95% 95%     Intake/Output Summary (Last 24 hours) at 06/10/15 0745 Last data filed at 06/10/15 0643  Gross per 24 hour  Intake    920 ml  Output   4425 ml  Net  -3505 ml   Filed Weights   06/08/15 0530 06/09/15 0500 06/10/15 0648  Weight: 278 lb (126.1 kg) 274 lb 14.6 oz (124.7 kg) 269 lb 3.2 oz (122.108 kg)    PHYSICAL EXAM  General: Pleasant, NAD. Neuro: Alert and oriented X 3. Moves all extremities spontaneously. Psych: Normal affect. HEENT:  Normal  Neck: Supple without bruits or JVD. Lungs:  Resp regular and unlabored, CTA. Heart: RRR no s3, s4, or murmurs. Abdomen: Soft, non-tender, non-distended, BS + x 4.  Extremities: No clubbing, cyanosis. DP/PT/Radials 2+ and equal bilaterally. 1-2+ LE edema.   Accessory Clinical Findings  CBC  Recent Labs  06/08/15 0223  WBC 16.3*  HGB 18.0*  HCT 57.0*  MCV 96.1  PLT 223   Basic Metabolic Panel  Recent Labs  06/07/15 2245  06/09/15 1315 06/10/15 0410  NA 137  < > 140 141  K 4.2  < > 4.5 3.7  CL 94*  < > 93* 94*  CO2 36*  < > 36* 40*  GLUCOSE  157*  < > 149* 92  BUN 28*  < > 26* 26*  CREATININE 0.87  < > 0.84 0.90  CALCIUM 8.8*  < > 9.6 9.2  MG 2.3  --   --   --   < > = values in this interval not displayed.  TELE  NSR at rate of 70-80s, transient bradycardia to 50s  Radiology/Studies  Dg Chest [facility]  06/04/2015   CLINICAL DATA:  Lethargic.  Low O2 sats.  Hypertension, smoker.  EXAM: PORTABLE CHEST - 1 VIEW  COMPARISON:  06/01/2015  FINDINGS: Cardiomegaly. Mild peribronchial thickening and interstitial prominence is stable since prior study, likely chronic bronchitis. No acute airspace opacities, effusions or edema. No acute bony abnormality.  IMPRESSION: Cardiomegaly.  Stable peribronchial thickening and interstitial prominence, likely chronic bronchitis.   Electronically Signed   By: Charlett Nose M.D.   On: 06/04/2015 18:05   Dg Chest Port 1 View  06/01/2015   CLINICAL DATA:  Hypoxia for 6 months. Bilateral lower extremity swelling today.  EXAM: PORTABLE CHEST - 1 VIEW  COMPARISON:  None.  FINDINGS: There is cardiomegaly and pulmonary vascular congestion. No consolidative process, pneumothorax or effusion.  IMPRESSION: Cardiomegaly without acute disease.   Electronically Signed   By: Drusilla Kanner M.D.   On: 06/01/2015 20:16    ASSESSMENT AND PLAN   1. Acute systolic heart failure - Diuresed 3.5L/23.8L and 5lb/30lb weight lost - Lungs sounds clear, however still has 1-2+ BL LE edema. May be able to switch to PO lasix today, otherwise tomorrow. Will discuss with MD. Consider compression stocking.  - Continue  Lisinopril and increase bisoprolol from 5 to 10 mg PO QD - EF 40%-45%, grade 2 DD, moderately dilated RV,  prior NSVT  2. Cardiomyopathy - Outpatient f/u with Dr Eldridge Dace, will likely need right and left heart cath after on good medical Rx and home CPAP.  - Slept well on CPAP last night  3. Acute hypercarbic respiratory failure - Per primary - leukocytosis in part from prednisone, improving  4. Tobacco  use - Nicotine patch. Encourage cessation.  5. Obesity - Discussed lifestyle change    LOS: 9 days   Signed, Bhagat,Bhavinkumar PA-C Pager [phone]  Will change to Lasix PO 40 BID Expect DC tomorrow. Great diuresis 30 lbs Wt loss.  Donato Schultz, MD

## 2015-06-10 NOTE — Progress Notes (Signed)
[facility] PROGRESS NOTE  Bob Rodriguez:[id number] DOB: 17-Aug-1971 DOA: 06/01/2015 PCP: No primary care provider on file.  Assessment/Plan: Acute respiratory failure with hypoxia Suspect secondary to acute combined systolic and diastolic congestive heart failure. He also appears to have an element of obstructive sleep apnea and possibly even obesity hypoventilation syndrome. Continue nebulizer treatments and steroids along with empiric antibiotics. Ultimately, patient will need home oxygen along with CPAP. Will benefit from outpatient sleep study. The referral form has been signed. His compliance with CPAP will be questionable. - Pt overall improving, remaining stable  Likely OHS/OSA Lifestyle changes discussed with the patient. Compliance with medication emphasized. See above.  Acute combined systolic and diastolic congestive heart failure with lower extremity edema Echocardiogram as above. Cardiology is following, input is appreciated. Lasix is being continued. Continue diuretics. Lasix transitioned to PO. Continue to monitor ins and outs. Daily weights. TSH is normal. A run of nonsustained V tach was recently noted on telemetry. Consider beta blocker. Will defer to cardiology. -Overall improving -thus far net neg 25L  Hypertensive urgency Blood pressure is much improved. Continue current dose of lisinopril. Patient was on intravenous nitroglycerin initially, which was discontinued.   Mildly elevated troponin Likely secondary to demand ischemia. No plans for inpatient workup. Cardiac catheterization to be pursued as an outpatient. Aspirin continued.  Polycythemia Likely secondary due to smoking. Leukocytosis is secondary to steroids.  Tobacco abuse Nicotine patch  Code Status: Full Family Communication: Pt in room (indicate person spoken with, relationship, and if by phone, the number) Disposition Plan:  Pending   Consultants:  Cardiology  Procedures:    Antibiotics:  Azithromycin 7/31>>>  HPI/Subjective: No complaints. States feeling better  Objective: Filed Vitals:   06/10/15 0648 06/10/15 0804 06/10/15 1045 06/10/15 1339  BP:   122/80 139/69  Pulse:    65  Temp:    98 F (36.7 C)  TempSrc:    Oral  Resp:    18  Height:      Weight: 122.108 kg (269 lb 3.2 oz)     SpO2:  98%  92%    Intake/Output Summary (Last 24 hours) at 06/10/15 1604 Last data filed at 06/10/15 1300  Gross per 24 hour  Intake   1220 ml  Output   3976 ml  Net  -2756 ml   Filed Weights   06/08/15 0530 06/09/15 0500 06/10/15 0648  Weight: 126.1 kg (278 lb) 124.7 kg (274 lb 14.6 oz) 122.108 kg (269 lb 3.2 oz)    Exam:   General:  Awake, in nad, ambulating in room  Cardiovascular: regular, s1,s 2  Respiratory: normal resp effort, no wheezing  Abdomen: soft,obese, nondistended, pos BS  Musculoskeletal: perfused, no clubbing   Data Reviewed: Basic Metabolic Panel:  Recent Labs Lab 06/07/15 0226 06/07/15 2245 06/08/15 0223 06/09/15 1315 06/10/15 0410  NA 138 137 138 140 141  K 4.4 4.2 4.0 4.5 3.7  CL 94* 94* 94* 93* 94*  CO2 32 36* 36* 36* 40*  GLUCOSE 126* 157* 136* 149* 92  BUN 22* 28* 28* 26* 26*  CREATININE 0.75 0.87 0.82 0.84 0.90  CALCIUM 9.1 8.8* 8.9 9.6 9.2  MG  --  2.3  --   --   --    Liver Function Tests: No results for input(s): AST, ALT, ALKPHOS, BILITOT, PROT, ALBUMIN in the last 168 hours. No results for input(s): LIPASE, AMYLASE in the last 168 hours. No results for input(s): AMMONIA in the last 168 hours. CBC:  Recent Labs Lab 06/04/15 0227 06/07/15 0226 06/08/15 0223  WBC 11.6* 19.8* 16.3*  HGB 17.8* 18.5* 18.0*  HCT 57.0* 57.5* 57.0*  MCV 97.8 95.2 96.1  PLT 220 242 223   Cardiac Enzymes: No results for input(s): CKTOTAL, CKMB, CKMBINDEX, TROPONINI in the last 168 hours. BNP (last 3 results)  Recent Labs  06/01/15 1940  BNP 315.6*     ProBNP (last 3 results) No results for input(s): PROBNP in the last 8760 hours.  CBG: No results for input(s): GLUCAP in the last 168 hours.  Recent Results (from the past 240 hour(s))  MRSA PCR Screening     Status: None   Collection Time: 06/02/15  3:52 AM  Result Value Ref Range Status   MRSA by PCR NEGATIVE NEGATIVE Final    Comment:        The GeneXpert MRSA Assay (FDA approved for NASAL specimens only), is one component of a comprehensive MRSA colonization surveillance program. It is not intended to diagnose MRSA infection nor to guide or monitor treatment for MRSA infections.      Studies: No results found.  Scheduled Meds: . antiseptic oral rinse  7 mL Mouth Rinse BID  . aspirin  325 mg Oral Daily  . [START ON 06/11/2015] bisoprolol  10 mg Oral Daily  . furosemide  40 mg Oral BID  . heparin  5,000 Units Subcutaneous 3 times per day  . ipratropium-albuterol  3 mL Nebulization BID  . lisinopril  20 mg Oral Daily  . nicotine  21 mg Transdermal Daily  . predniSONE  60 mg Oral Q breakfast  . sodium chloride  3 mL Intravenous Q12H   Continuous Infusions:   Principal Problem:   Acute combined systolic and diastolic congestive heart failure Active Problems:   Hypertensive urgency   Acute CHF   Tobacco use disorder   Hypertension, uncontrolled   Acute respiratory failure with hypoxia and hypercarbia   OSA (obstructive sleep apnea)   COPD with acute exacerbation   Anasarca   Hypoxia    Jerami Tammen K  [facility] Pager [phone]. If 7PM-7AM, please contact night-coverage at [email or web address], password [facility] 06/10/2015, 4:04 PM  LOS: 9 days

## 2015-06-10 NOTE — Progress Notes (Signed)
UR Completed. Laranda Burkemper, RN, BSN.  [phone] 

## 2015-06-11 DIAGNOSIS — Z9114 Patient's other noncompliance with medication regimen: Secondary | ICD-10-CM

## 2015-06-11 LAB — BASIC METABOLIC PANEL WITH GFR
Anion gap: 14 (ref 5–15)
BUN: 24 mg/dL — ABNORMAL HIGH (ref 6–20)
CO2: 29 mmol/L (ref 22–32)
Calcium: 9 mg/dL (ref 8.9–10.3)
Chloride: 95 mmol/L — ABNORMAL LOW (ref 101–111)
Creatinine, Ser: 0.92 mg/dL (ref 0.61–1.24)
GFR calc Af Amer: >60 mL/min
GFR calc non Af Amer: >60 mL/min
Glucose, Bld: 86 mg/dL (ref 65–99)
Potassium: 4.7 mmol/L (ref 3.5–5.1)
Sodium: 138 mmol/L (ref 135–145)

## 2015-06-11 LAB — MAGNESIUM: Magnesium: 2.5 mg/dL — ABNORMAL HIGH (ref 1.7–2.4)

## 2015-06-11 MED ORDER — BISOPROLOL FUMARATE 10 MG PO TABS: 10.0000 mg | ORAL_TABLET | Freq: Every day | ORAL | Status: DC

## 2015-06-11 MED ORDER — ASPIRIN 325 MG PO TABS: 325.0000 mg | ORAL_TABLET | Freq: Every day | ORAL | Status: DC

## 2015-06-11 MED ORDER — FUROSEMIDE 40 MG PO TABS: 40.0000 mg | ORAL_TABLET | Freq: Two times a day (BID) | ORAL | Status: DC

## 2015-06-11 MED ORDER — LISINOPRIL 20 MG PO TABS: 20.0000 mg | ORAL_TABLET | Freq: Every day | ORAL | Status: DC

## 2015-06-11 MED ORDER — PREDNISONE 5 MG PO TABS: 5.0000 mg | ORAL_TABLET | Freq: Every day | ORAL | Status: DC

## 2015-06-11 NOTE — Progress Notes (Signed)
Patient d/ced this afternoon. All assessments remains unchanged as at now. Awaiting on transportation from family.

## 2015-06-11 NOTE — Care Management Note (Signed)
Case Management Note  Patient Details  Name: Bob Rodriguez MRN: [id number] Date of Birth: 1971-03-16  Subjective/Objective:    Pt admitted with acute combined systolic and diastolic congestive heart failure                Action/Plan:  Pt is independent from home.  Pt does not have insurance nor PCP on file.  CM will assess and monitor for disposition plan   Expected Discharge Date:                  Expected Discharge Plan:  Home/Self Care  In-House Referral:     Discharge planning Services: CM , Indigent Clinic  Post Acute Care Choice:    Choice offered to:     DME Arranged:    DME Agency:     HH Arranged:    HH Agency:     Status of Service:  Complete, will sign off  Medicare Important Message Given:  no Date Medicare IM Given:    Medicare IM give by:    Date Additional Medicare IM Given:    Additional Medicare Important Message give by:     If discussed at Long Length of Stay Meetings, dates discussed:    Additional Comments: CM assessed pt; pt informed CM that he already has an appointment at the [facility] for 8/8 at 2:30 pm, pt stated he was aware that he could obtain medications at the clinic post discharge.  CM reviewed pts list, AVR prescriptions will be available at [facility] for approximately $4, CM verfiied with [facility] pharmacy that bisoprolol is also available for stated price.  CM also verified with [facility] that prescribed meds are available for pick up today.  CM informed pt of the price at [facility], pt stated that he could pay the copay for the medications.  No other CM needs.  Cherylann Parr, RN 06/11/2015, 2:00 PM

## 2015-06-11 NOTE — Progress Notes (Signed)
Patient Name: Bob Rodriguez Date of Encounter: 06/11/2015   SUBJECTIVE  Feeling much better. Denies chest pain, sob  or palpitation.   CURRENT MEDS . antiseptic oral rinse  7 mL Mouth Rinse BID  . aspirin  325 mg Oral Daily  . bisoprolol  10 mg Oral Daily  . furosemide  40 mg Oral BID  . heparin  5,000 Units Subcutaneous 3 times per day  . ipratropium-albuterol  3 mL Nebulization BID  . lisinopril  20 mg Oral Daily  . nicotine  21 mg Transdermal Daily  . predniSONE  60 mg Oral Q breakfast  . sodium chloride  3 mL Intravenous Q12H    OBJECTIVE  Filed Vitals:   06/10/15 2005 06/11/15 0418 06/11/15 0724 06/11/15 1042  BP: 148/83 166/104  119/61  Pulse: 80 76  62  Temp: 97.5 F (36.4 C) 97.5 F (36.4 C)    TempSrc: Oral Axillary    Resp: 18 20    Height:      Weight:  267 lb 13.7 oz (121.5 kg)    SpO2: 92% 99% 93%     Intake/Output Summary (Last 24 hours) at 06/11/15 1144 Last data filed at 06/10/15 2335  Gross per 24 hour  Intake    575 ml  Output   2350 ml  Net  -1775 ml   Filed Weights   06/09/15 0500 06/10/15 0648 06/11/15 0418  Weight: 274 lb 14.6 oz (124.7 kg) 269 lb 3.2 oz (122.108 kg) 267 lb 13.7 oz (121.5 kg)    PHYSICAL EXAM  General: Pleasant, NAD. Neuro: Alert and oriented X 3. Moves all extremities spontaneously. Psych: Normal affect. HEENT:  Normal  Neck: Supple without bruits or JVD. Lungs:  Resp regular and unlabored, CTA. Heart: RRR no s3, s4, or murmurs. Abdomen: Soft, non-tender, non-distended, BS + x 4.  Extremities: No clubbing, cyanosis. DP/PT/Radials 2+ and equal bilaterally. 1+ LE edema.   Accessory Clinical Findings  CBC No results for input(s): WBC, NEUTROABS, HGB, HCT, MCV, PLT in the last 72 hours. Basic Metabolic Panel  Recent Labs  06/10/15 0410 06/11/15 0410  NA 141 138  K 3.7 4.7  CL 94* 95*  CO2 40* 29  GLUCOSE 92 86  BUN 26* 24*  CREATININE 0.90 0.92  CALCIUM 9.2 9.0  MG  --  2.5*    TELE  NSR at rate  of 70s  Radiology/Studies  Dg Chest Port 1 View  06/04/2015   CLINICAL DATA:  Lethargic.  Low O2 sats.  Hypertension, smoker.  EXAM: PORTABLE CHEST - 1 VIEW  COMPARISON:  06/01/2015  FINDINGS: Cardiomegaly. Mild peribronchial thickening and interstitial prominence is stable since prior study, likely chronic bronchitis. No acute airspace opacities, effusions or edema. No acute bony abnormality.  IMPRESSION: Cardiomegaly.  Stable peribronchial thickening and interstitial prominence, likely chronic bronchitis.   Electronically Signed   By: Charlett Nose M.D.   On: 06/04/2015 18:05   Dg Chest Port 1 View  06/01/2015   CLINICAL DATA:  Hypoxia for 6 months. Bilateral lower extremity swelling today.  EXAM: PORTABLE CHEST - 1 VIEW  COMPARISON:  None.  FINDINGS: There is cardiomegaly and pulmonary vascular congestion. No consolidative process, pneumothorax or effusion.  IMPRESSION: Cardiomegaly without acute disease.   Electronically Signed   By: Drusilla Kanner M.D.   On: 06/01/2015 20:16    ASSESSMENT AND PLAN   1. Acute systolic heart failure - Diuresed 2.9L/26.7L and 2lb/32lb weight lost - Lungs sounds clear. Switched to  PO lasix yesterday, good diuresed. LE improved on stockings. Seems stable to discharge today.   - Continue  Lisinopril and increase bisoprolol 10 mg PO QD - EF 40%-45%, grade 2 DD, moderately dilated RV,  prior NSVT  2. Cardiomyopathy - Outpatient f/u with Dr Eldridge Dace, will likely need right and left heart cath after on good medical Rx and home CPAP.   - Slept well on CPAP last night  3. Acute hypercarbic respiratory failure - Per primary  4. Tobacco use - Nicotine patch. Encourage cessation.  5. Obesity - Discussed lifestyle change    LOS: 10 days   Signed, Bhagat,Bhavinkumar PA-C Pager [phone]   Personally seen and examined. Agree with above. OK to DC Send on lasix 40 BID Continue other hosp meds.  F/U with Dr. Eldridge Dace as above  Donato Schultz, MD

## 2015-06-11 NOTE — Discharge Summary (Signed)
Physician Discharge Summary  Bob Rodriguez ZOX:[id number] DOB: Jul 23, 1971 DOA: 06/01/2015  PCP: No primary care provider on file.  Admit date: 06/01/2015 Discharge date: 06/11/2015  Time spent: 20 minutes  Recommendations for Outpatient Follow-up:  1. Follow up with PCP as scheduled 2. Follow up with Cardiology as scheduled 3. Follow up with outpatient sleep study, to be scheduled 4. Please repeat Renal Panel in 1-2 weeks  Discharge Diagnoses:  Principal Problem:   Acute combined systolic and diastolic congestive heart failure Active Problems:   Hypertensive urgency   Acute CHF   Tobacco use disorder   Hypertension, uncontrolled   Acute respiratory failure with hypoxia and hypercarbia   OSA (obstructive sleep apnea)   COPD with acute exacerbation   Anasarca   Hypoxia   Hypertensive emergency   Lethargic   Non compliance w medication regimen   Discharge Condition: Improved  Diet recommendation: Heart Healthy  Filed Weights   06/09/15 0500 06/10/15 0648 06/11/15 0418  Weight: 124.7 kg (274 lb 14.6 oz) 122.108 kg (269 lb 3.2 oz) 121.5 kg (267 lb 13.7 oz)    History of present illness:  Please see admit h and p from 7/27 for details. Briefly, pt presented with sob and LE swelling with hypoxia and O2 sats to the 80's. Pt had been noncompliant with medications prior to admit. The patient was ultimately admitted with concerns of acute heart failure.  Hospital Course:  Acute respiratory failure with hypoxia Suspect secondary to acute combined systolic and diastolic congestive heart failure. He also appears to have an element of obstructive sleep apnea and possibly even obesity hypoventilation syndrome. Continue nebulizer treatments and steroids along with empiric antibiotics. Ultimately, patient will need home ox-ygen along with CPAP. Will benefit from outpatient sleep study. The referral form was signed. His compliance with CPAP will be questionable. - Pt overall much  improved  Likely OHS/OSA Lifestyle changes discussed with the patient. Compliance with medication emphasized. See above.  Acute combined systolic and diastolic congestive heart failure with lower extremity edema Echocardiogram as above. Cardiology is following, input is appreciated. Lasix is being continued. Continue diuretics. Lasix transitioned to PO. TSH is normal. A run of nonsustained V tach was recently noted on telemetry. Lytes corrected. -Overall much improved -net neg 27L on discharge -Wt on discharge 121.5kg  Hypertensive urgency Blood pressure is much improved. Continue current dose of lisinopril. Patient was on intravenous nitroglycerin initially, which was discontinued.   Mildly elevated troponin Likely secondary to demand ischemia. No plans for inpatient workup. Cardiac catheterization to be pursued as an outpatient. Aspirin continued.  Polycythemia Likely secondary due to smoking. Leukocytosis is secondary to steroids.  Tobacco abuse Nicotine patch  Consultations:  Cardiology  Pulmonary  Discharge Exam: Filed Vitals:   06/10/15 2005 06/11/15 0418 06/11/15 0724 06/11/15 1042  BP: 148/83 166/104  119/61  Pulse: 80 76  62  Temp: 97.5 F (36.4 C) 97.5 F (36.4 C)    TempSrc: Oral Axillary    Resp: 18 20    Height:      Weight:  121.5 kg (267 lb 13.7 oz)    SpO2: 92% 99% 93%     General: Awake, in nad Cardiovascular: regular, s1, s2 Respiratory: normal resp effort, no wheezing  Discharge Instructions     Medication List    STOP taking these medications        GOODYS BODY PAIN PO      TAKE these medications        aspirin 325  MG tablet  Take 1 tablet (325 mg total) by mouth daily.     bisoprolol 10 MG tablet  Commonly known as:  ZEBETA  Take 1 tablet (10 mg total) by mouth daily.     furosemide 40 MG tablet  Commonly known as:  LASIX  Take 1 tablet (40 mg total) by mouth 2 (two) times daily.     lisinopril 20 MG tablet  Commonly known  as:  PRINIVIL,ZESTRIL  Take 1 tablet (20 mg total) by mouth daily.     multivitamin tablet  Take 1 tablet by mouth daily.     OMEGA 3 PO  Take by mouth 2 (two) times daily.     OVER THE COUNTER MEDICATION  Take 1 tablet by mouth at bedtime.     predniSONE 5 MG tablet  Commonly known as:  DELTASONE  Take 1 tablet (5 mg total) by mouth daily with breakfast.       Allergies  Allergen Reactions  . Codeine Other (See Comments)    unknown       Follow-up Information    Follow up with [facility]    .   Why:  Appointment Thursday, August 4th @ 10:30   Contact information:   [address] [place] [address] [phone]      Follow up with Nicolasa Ducking, NP On 07/02/2015.   Specialties:  Nurse Practitioner, Cardiology, Radiology   Why:  @11am  post hospital cardiology   Contact information:   [address] [place] [address] [phone]       Follow up with You will be contacted to schedule an outpatient sleep study.       The results of significant diagnostics from this hospitalization (including imaging, microbiology, ancillary and laboratory) are listed below for reference.    Significant Diagnostic Studies: Dg Chest Port 1 View  06/04/2015   CLINICAL DATA:  Lethargic.  Low O2 sats.  Hypertension, smoker.  EXAM: PORTABLE CHEST - 1 VIEW  COMPARISON:  06/01/2015  FINDINGS: Cardiomegaly. Mild peribronchial thickening and interstitial prominence is stable since prior study, likely chronic bronchitis. No acute airspace opacities, effusions or edema. No acute bony abnormality.  IMPRESSION: Cardiomegaly.  Stable peribronchial thickening and interstitial prominence, likely chronic bronchitis.   Electronically Signed   By: Charlett Nose M.D.   On: 06/04/2015 18:05   Dg Chest Port 1 View  06/01/2015   CLINICAL DATA:  Hypoxia for 6 months. Bilateral lower extremity swelling today.  EXAM: PORTABLE CHEST -  1 VIEW  COMPARISON:  None.  FINDINGS: There is cardiomegaly and pulmonary vascular congestion. No consolidative process, pneumothorax or effusion.  IMPRESSION: Cardiomegaly without acute disease.   Electronically Signed   By: Drusilla Kanner M.D.   On: 06/01/2015 20:16    Microbiology: Recent Results (from the past 240 hour(s))  MRSA PCR Screening     Status: None   Collection Time: 06/02/15  3:52 AM  Result Value Ref Range Status   MRSA by PCR NEGATIVE NEGATIVE Final    Comment:        The GeneXpert MRSA Assay (FDA approved for NASAL specimens only), is one component of a comprehensive MRSA colonization surveillance program. It is not intended to diagnose MRSA infection nor to guide or monitor treatment for MRSA infections.      Labs: Basic Metabolic Panel:  Recent Labs Lab 06/07/15 2245 06/08/15 0223 06/09/15 1315 06/10/15 0410 06/11/15 0410  NA 137 138  140 141 138  K 4.2 4.0 4.5 3.7 4.7  CL 94* 94* 93* 94* 95*  CO2 36* 36* 36* 40* 29  GLUCOSE 157* 136* 149* 92 86  BUN 28* 28* 26* 26* 24*  CREATININE 0.87 0.82 0.84 0.90 0.92  CALCIUM 8.8* 8.9 9.6 9.2 9.0  MG 2.3  --   --   --  2.5*   Liver Function Tests: No results for input(s): AST, ALT, ALKPHOS, BILITOT, PROT, ALBUMIN in the last 168 hours. No results for input(s): LIPASE, AMYLASE in the last 168 hours. No results for input(s): AMMONIA in the last 168 hours. CBC:  Recent Labs Lab 06/07/15 0226 06/08/15 0223  WBC 19.8* 16.3*  HGB 18.5* 18.0*  HCT 57.5* 57.0*  MCV 95.2 96.1  PLT 242 223   Cardiac Enzymes: No results for input(s): CKTOTAL, CKMB, CKMBINDEX, TROPONINI in the last 168 hours. BNP: BNP (last 3 results)  Recent Labs  06/01/15 1940  BNP 315.6*    ProBNP (last 3 results) No results for input(s): PROBNP in the last 8760 hours.  CBG: No results for input(s): GLUCAP in the last 168 hours.   Signed:  Billal Rollo, Scheryl Marten  [facility] 06/11/2015, 12:24 PM

## 2015-06-14 ENCOUNTER — Encounter: Payer: Self-pay | Admitting: Family Medicine

## 2015-06-14 ENCOUNTER — Ambulatory Visit: Payer: Self-pay | Attending: Internal Medicine | Admitting: Family Medicine

## 2015-06-14 VITALS — BP 131/84 | HR 61 | Temp 98.1°F | Ht 65.0 in | Wt 261.0 lb

## 2015-06-14 DIAGNOSIS — I10 Essential (primary) hypertension: Secondary | ICD-10-CM | POA: Insufficient documentation

## 2015-06-14 DIAGNOSIS — F17200 Nicotine dependence, unspecified, uncomplicated: Secondary | ICD-10-CM

## 2015-06-14 DIAGNOSIS — Z7952 Long term (current) use of systemic steroids: Secondary | ICD-10-CM | POA: Insufficient documentation

## 2015-06-14 DIAGNOSIS — G4733 Obstructive sleep apnea (adult) (pediatric): Secondary | ICD-10-CM | POA: Insufficient documentation

## 2015-06-14 DIAGNOSIS — F17210 Nicotine dependence, cigarettes, uncomplicated: Secondary | ICD-10-CM | POA: Insufficient documentation

## 2015-06-14 DIAGNOSIS — Z7982 Long term (current) use of aspirin: Secondary | ICD-10-CM | POA: Insufficient documentation

## 2015-06-14 DIAGNOSIS — J441 Chronic obstructive pulmonary disease with (acute) exacerbation: Secondary | ICD-10-CM | POA: Insufficient documentation

## 2015-06-14 DIAGNOSIS — Z720 Tobacco use: Secondary | ICD-10-CM

## 2015-06-14 DIAGNOSIS — Z79899 Other long term (current) drug therapy: Secondary | ICD-10-CM | POA: Insufficient documentation

## 2015-06-14 DIAGNOSIS — I509 Heart failure, unspecified: Secondary | ICD-10-CM | POA: Insufficient documentation

## 2015-06-14 NOTE — Patient Instructions (Signed)

## 2015-06-14 NOTE — Progress Notes (Signed)
Patient spent 2 weeks at [facility] for CHF He states he is feeling better and is taking all medications prescribed He smoked 2 ppd cigarettes before being hospitalized and is back smoking 0.5 ppd and would like help quiting He wonders if it is ok to take Goody's headache powders with the medication he is currently on He complains of feeling clammy He has a sleep study that had to be scheduled October 31'st.  He wishes he could have it done sooner and start receiving the treatment he needs.

## 2015-06-14 NOTE — Progress Notes (Signed)
Bob Rodriguez, is a 44 y.o. male  MWN:[id number]  QIH:[id number]  DOB - 08-Jul-1971  Admit date: 06/01/15 Discharge date: 06/11/15  CC:  Chief Complaint  Patient presents with  . Hospitalization Follow-up  . Congestive Heart Failure       HPI: Bob Rodriguez is a 44 y.o. male with a history of hypertension, CHF, tobacco abuse, COPD who recently presented to [facility] ED with shortness of breath and lower extremity edema and was found to be hypoxic with an oxygen saturation of 80%.  He was admitted with a diagnosis of ? Acute CHF.Blood pressure was 175/109, Troponins were 0.053 (elevation was thought to be secondary to demand ischemia) He had a BNP of 315.6, his chest x-ray revealed cardiomegaly without acute disease; his 2-D echo reveals an LVEF of 40-45% with mildly to moderately reduced systolic function in keeping with grade 2 diastolic dysfunction, mildly dilated left atrium, moderately dilated right ventricle with mildly reduced systolic function. He was seen by cardiology and commenced on  Lasix with resulting diuresis of 27L  throughout his hospitalization. He was placed on nebulizer treatments, steroids and empiric antibiotics as well as a CPAP for treatment of sleep apnea. The patient had also been dosed running out of his medications which were all resumed and he was set up for an appointment for an outpatient sleep study and a follow up with Cardiology and Pulmonary.  Interval history: He reports doing well and has been compliant with his antihypertensives. He has upcoming appointments with cardiology, pulmonary, outpatient clinic study. Patient has No headache, No chest pain, No abdominal pain - No Nausea, No new weakness tingling or numbness, No Cough - SOB.  Allergies  Allergen Reactions  . Codeine Other (See Comments)    unknown   Past Medical History  Diagnosis Date  . Hypertension    Current Outpatient Prescriptions on File Prior to Visit  Medication Sig Dispense  Refill  . aspirin 325 MG tablet Take 1 tablet (325 mg total) by mouth daily.    . bisoprolol (ZEBETA) 10 MG tablet Take 1 tablet (10 mg total) by mouth daily. 30 tablet 0  . furosemide (LASIX) 40 MG tablet Take 1 tablet (40 mg total) by mouth 2 (two) times daily. 60 tablet 0  . lisinopril (PRINIVIL,ZESTRIL) 20 MG tablet Take 1 tablet (20 mg total) by mouth daily. 30 tablet 0  . Multiple Vitamin (MULTIVITAMIN) tablet Take 1 tablet by mouth daily.    . Omega-3 Fatty Acids (OMEGA 3 PO) Take by mouth 2 (two) times daily.    Marland Kitchen OVER THE COUNTER MEDICATION Take 1 tablet by mouth at bedtime.    . predniSONE (DELTASONE) 5 MG tablet Take 1 tablet (5 mg total) by mouth daily with breakfast.     No current facility-administered medications on file prior to visit.   No family history on file. History   Social History  . Marital Status: Divorced    Spouse Name: N/A  . Number of Children: N/A  . Years of Education: N/A   Occupational History  . Not on file.   Social History Main Topics  . Smoking status: Current Some Day Smoker -- 0.50 packs/day  . Smokeless tobacco: Not on file  . Alcohol Use: No  . Drug Use: Not on file  . Sexual Activity: Not on file   Other Topics Concern  . Not on file   Social History Narrative    Review of Systems: Constitutional: Negative for fever, chills, diaphoresis, activity  change, appetite change and fatigue. HENT: Negative for ear pain, nosebleeds, congestion, facial swelling, rhinorrhea, neck pain, neck stiffness and ear discharge.  Eyes: Negative for pain, discharge, redness, itching and visual disturbance. Respiratory: Negative for cough, choking, chest tightness, shortness of breath, wheezing and stridor.  Cardiovascular: Negative for chest pain, palpitations and leg swelling. Gastrointestinal: Negative for abdominal distention. Genitourinary: Negative for dysuria, urgency, frequency, hematuria, flank pain, decreased urine volume, difficulty urinating  and dyspareunia.  Musculoskeletal: Negative for back pain, joint swelling, arthralgia and gait problem. Neurological: Negative for dizziness, tremors, seizures, syncope, facial asymmetry, speech difficulty, weakness, light-headedness, numbness and headaches.  Hematological: Negative for adenopathy. Does not bruise/bleed easily. Skin: Negative for rash, ulcer. Psychiatric/Behavioral: Negative for hallucinations, behavioral problems, confusion, dysphoric mood, decreased concentration and agitation.    Objective:   Filed Vitals:   06/14/15 1438  BP: 131/84  Pulse: 61  Temp: 98.1 F (36.7 C)    Physical Exam: Constitutional: Patient is obese, well-developed and well-nourished. No distress. HENT: Normocephalic, atraumatic, External right and left ear normal. Oropharynx is clear and moist.  Eyes: Conjunctivae and EOM are normal. PERRLA, no scleral icterus. Neck: Normal ROM, No JVD. No tracheal deviation. No thyromegaly. CVS: RRR, S1/S2 +, no murmurs, no gallops, no carotid bruit.  Pulmonary: Effort and breath sounds normal, no stridor, rhonchi, wheezes, rales.  Abdominal: Soft. BS +, no distension, tenderness, rebound or guarding.  Musculoskeletal: Normal range of motion. No edema and no tenderness.  Lymphadenopathy: No lymphadenopathy noted, cervical, inguinal or axillary Neuro: Alert. Normal reflexes, muscle tone coordination. No cranial nerve deficit. Skin: Skin is warm and dry. No rash noted. Not diaphoretic. No erythema. No pallor. Psychiatric: Normal mood and affect. Behavior, judgment, thought content normal.  Lab Results  Component Value Date   WBC 16.3* 06/08/2015   HGB 18.0* 06/08/2015   HCT 57.0* 06/08/2015   MCV 96.1 06/08/2015   PLT 223 06/08/2015   Lab Results  Component Value Date   CREATININE 0.92 06/11/2015   BUN 24* 06/11/2015   NA 138 06/11/2015   K 4.7 06/11/2015   CL 95* 06/11/2015   CO2 29 06/11/2015   Transthoracic Echocardiography  Patient:   Bob Rodriguez, Bob Rodriguez MR #:    [id number] Study Date: 06/03/2015 Gender:   M Age:    43 Height:   170.2 cm Weight:   134.3 kg BSA:    2.59 m^2 Pt. Status: Room:    2C14C  SONOGRAPHER Nolon Rod, RDCS ADMITTING  Victorio Palm REFERRING  Hillary Bow ATTENDING  Rolland Porter [id number] PERFORMING  [facility], Inpatient  cc:  ------------------------------------------------------------------- LV EF: 40% -  45%  ------------------------------------------------------------------- Indications:   CHF - 428.0.  ------------------------------------------------------------------- History:  Risk factors: Current tobacco use. Hypertension. Morbidly obese.  ------------------------------------------------------------------- Study Conclusions  - Left ventricle: The cavity size was mildly dilated. Wall thickness was increased in a pattern of mild LVH. Systolic function was mildly to moderately reduced. The estimated ejection fraction was in the range of 40% to 45%. Images were inadequate for LV wall motion assessment. Features are consistent with a pseudonormal left ventricular filling pattern, with concomitant abnormal relaxation and increased filling pressure (grade 2 diastolic dysfunction). - Aortic valve: There was no stenosis. - Mitral valve: There was no significant regurgitation. - Left atrium: The atrium was mildly dilated. - Right ventricle: The cavity size was moderately dilated. Systolic function was mildly reduced. - Right atrium: The atrium was mildly dilated. - Tricuspid valve: Poorly  visualized. - Pulmonary arteries: No complete TR doppler jet so unable to estimate PA systolic pressure.  Impressions:  - Technically difficult study, images very poor even with Definity. Mildly dilated LV with mild LV hypertrophy. EF probably around 40-45%, difficult to comment on individual wall  segments. Moderately dilated RV with mildly decreased systolic function.  Transthoracic echocardiography. M-mode, complete 2D, spectral Doppler, and color Doppler. Birthdate: Patient birthdate: Sep 21, 1971. Age: Patient is 44 yr old. Sex: Gender: male. BMI: 46.4 kg/m^2. Blood pressure:   129/57 Patient status: Inpatient. Study date: Study date: 06/03/2015. Study time: 10:43 AM. Location: ICU/CCU  -------------------------------------------------------------------  ------------------------------------------------------------------- Left ventricle: The cavity size was mildly dilated. Wall thickness was increased in a pattern of mild LVH. Systolic function was mildly to moderately reduced. The estimated ejection fraction was in the range of 40% to 45%. Images were inadequate for LV wall motion assessment. Features are consistent with a pseudonormal left ventricular filling pattern, with concomitant abnormal relaxation and increased filling pressure (grade 2 diastolic dysfunction).  ------------------------------------------------------------------- Aortic valve:  Trileaflet. Doppler:  There was no stenosis. There was no regurgitation.  ------------------------------------------------------------------- Aorta: Aortic root: The aortic root was normal in size. Ascending aorta: The ascending aorta was normal in size.  ------------------------------------------------------------------- Mitral valve:  Normal thickness leaflets . Doppler:  There was no evidence for stenosis.  There was no significant regurgitation.  Peak gradient (D): 2 mm Hg.  ------------------------------------------------------------------- Left atrium: The atrium was mildly dilated.  ------------------------------------------------------------------- Right ventricle: The cavity size was moderately dilated. Systolic function was mildly  reduced.  ------------------------------------------------------------------- Pulmonic valve:  Structurally normal valve.  Cusp separation was normal. Doppler: Transvalvular velocity was within the normal range. There was no regurgitation.  ------------------------------------------------------------------- Tricuspid valve: Poorly visualized.  ------------------------------------------------------------------- Pulmonary artery:  No complete TR doppler jet so unable to estimate PA systolic pressure.  ------------------------------------------------------------------- Right atrium: The atrium was mildly dilated.  ------------------------------------------------------------------- Pericardium: There was no pericardial effusion.     Assessment and plan:  44 year old male with a history of hypertension, CHF (EF 40-45%), tobacco abuse, OSA,COPD who was recently hospitalized for acute respiratory failure secondary to CHF and COPD exacerbation.  CHF: EF 40-45%. No evidence of acute exacerbation. Continue Lasix; we'll check renal function at next visit. Continue daily weights, limit fluids to 2 L per day, low-sodium, heart healthy diet.  Hypertension: Controlled continue lisinopril and Bisoprolol Spent 5 minutes educating on on lifestyle changes including weight loss, DASH diet  COPD exacerbation: Currently on a prednisone taper. Will need to be reassessed at his next visit for need for maintenance medications.  Tobacco abuse: Spent 4 minutes counseling on smoking cessation and he will be using nicotine patches but has to get them OTC He is working on quitting.  Obstructive sleep apnea. Has an upcoming appointment for sleep study.    The patient was given clear instructions to go to ER or return to medical center if symptoms don't improve, worsen or new problems develop. The patient verbalized understanding. The patient was told to call to get lab results if they  haven't heard anything in the next week.     Jaclyn Shaggy, MD. [facility] [phone] 06/14/2015, 2:55 PM

## 2015-06-15 ENCOUNTER — Telehealth: Payer: Self-pay | Admitting: Family Medicine

## 2015-06-15 NOTE — Telephone Encounter (Signed)
Gained some lbs and is concerned about potential water retention in lungs, thank you. Pt is also wondering if he is able to take benadryl with his current medications.

## 2015-06-23 ENCOUNTER — Telehealth: Payer: Self-pay | Admitting: Pulmonary Disease

## 2015-06-23 ENCOUNTER — Encounter: Payer: Self-pay | Admitting: Pulmonary Disease

## 2015-06-23 ENCOUNTER — Ambulatory Visit (INDEPENDENT_AMBULATORY_CARE_PROVIDER_SITE_OTHER): Payer: Self-pay | Admitting: Pulmonary Disease

## 2015-06-23 ENCOUNTER — Telehealth: Payer: Self-pay | Admitting: *Deleted

## 2015-06-23 ENCOUNTER — Encounter: Payer: Self-pay | Admitting: *Deleted

## 2015-06-23 VITALS — BP 126/78 | HR 78 | Ht 65.0 in | Wt 263.6 lb

## 2015-06-23 DIAGNOSIS — J9612 Chronic respiratory failure with hypercapnia: Secondary | ICD-10-CM

## 2015-06-23 DIAGNOSIS — J449 Chronic obstructive pulmonary disease, unspecified: Secondary | ICD-10-CM

## 2015-06-23 DIAGNOSIS — Z720 Tobacco use: Secondary | ICD-10-CM

## 2015-06-23 DIAGNOSIS — J9610 Chronic respiratory failure, unspecified whether with hypoxia or hypercapnia: Secondary | ICD-10-CM | POA: Insufficient documentation

## 2015-06-23 DIAGNOSIS — F17200 Nicotine dependence, unspecified, uncomplicated: Secondary | ICD-10-CM

## 2015-06-23 DIAGNOSIS — E669 Obesity, unspecified: Secondary | ICD-10-CM

## 2015-06-23 NOTE — Progress Notes (Signed)
Subjective:    Patient ID: Bob Rodriguez, male    DOB: 1971-09-23, 44 y.o.   MRN: [id number]  HPI Patient known to me from recent hospitalization with acute hypercarbic & hypoxic respiratory failure likely due to underlying OSA, probable COPD, & diastolic congestive heart failure.  Chronic hypercarbic respiratory failure:  Likely secondary to a combination of COPD & OSA.  Patient appears to be compensated based on ABG. No recent readmission to the hospital since discharge.   Probable COPD:  He reports he just finished his steroid taper yesterday (8/16). He reports he has had a mild cough recently that he attributes to resuming his tobacco use. Cough produces very minimal mucus that he describes as "cream color". No hemoptysis. He denies any wheezing or dyspnea.   Probable OSA: Patient was on CPAP during recent hospitalization with 10 cm H2O water pressure & 3 L/m oxygen bleed in. He has been scheduled for a polysomnogram on 10/31 and is on a waiting list. He reports he is sleeping ok for 2-3 hours at a time in his recliner. No daytime napping.   Ongoing tobacco use:  He reports he is up to 25 cigarettes daily.  Previously was smoking 2-3 packs per day. He reports he had stopped while using patches in the hospital. He called the [facility] Quit Hotline and they are sending him some patches to use.  Review of Systems He denies any fever, chills, or sweats. No chest pain or pressure. He does have some chest discomfort with large meals. He denies any lower extremity edema. A pertinent 14 point review of systems is negative except as per the history of presenting illness.  Allergies  Allergen Reactions  . Codeine Other (See Comments)    unknown   Past Medical History  Diagnosis Date  . Hypertension   . CHF (congestive heart failure)     Systolic & Diastolic  . Respiratory failure with hypercapnia   . COPD (chronic obstructive pulmonary disease)    Past Surgical History  Procedure Laterality Date    . None      Family History  Problem Relation Age of Onset  . Diabetes Mother   . Coronary artery disease Father   . Asthma Brother   . Cancer Neg Hx   . Coronary artery disease Paternal Uncle     Social History   Social History  . Marital Status: Divorced    Spouse Name: N/A  . Number of Children: N/A  . Years of Education: N/A   Occupational History  . self employed    Social History Main Topics  . Smoking status: Current Some Day Smoker -- 1.00 packs/day for 30 years    Types: Cigarettes    Start date: 07/24/1983  . Smokeless tobacco: None     Comment: Peak rate of 3ppd  . Alcohol Use: 0.0 oz/week    0 Standard drinks or equivalent per week     Comment: Social EtOH but hasn't used in some time  . Drug Use: Yes     Comment: Remote marijuana use  . Sexual Activity: Not Asked   Other Topics Concern  . None   Social History Narrative   Originally he is from [place]. Previously lived in [place]. No international travel. Prior travel to [place], [place], & [place]. Previously worked in remodeling. Has exposure to asbestos & silica. Questionable mold exposure. Remote parakeet exposure. Owned a pet bat for a couple of days.       Objective:  Physical Exam Blood pressure 126/78, pulse 78, height  (1.651 m), weight 263 lb 9.6 oz (119.568 kg), SpO2 96 %. General:  Awake. Alert. No acute distress. Obese Caucasian male.  Integument:  Warm & dry. No rash on exposed skin. No bruising. Lymphatics:  No appreciated cervical or supraclavicular lymphadenoapthy. HEENT:  Moist mucus membranes. No oral ulcers. No scleral injection or icterus. No nasal turbinate swelling. PERRL. Cardiovascular:  Regular rate. No edema. No appreciable JVD.  Pulmonary:  Mildly decreased breath sounds bilateral lung bases but otherwise clear to auscultation. Symmetric chest wall expansion. No accessory muscle use. Abdomen: Soft. Normal bowel sounds. Protuberant. Grossly nontender. Musculoskeletal:  Normal bulk and tone.  Hand grip strength 5/5 bilaterally. No joint deformity or effusion appreciated. Neurological:  CN 2-12 grossly in tact. No meningismus. Moving all 4 extremities equally. Symmetric patellar deep tendon reflexes. Psychiatric:  Mood and affect congruent. Speech normal rhythm, rate & tone.   IMAGING PORTABLE CXR 06/04/15 (personally reviewed by me): Suggestion of cardiomegaly. Low lung volumes with mild bilateral interstitial prominence. Peribronchial wall thickening noted. No focal opacity appreciated. No pleural effusion appreciated.  BILAT LE VENOUS DUPLEX (06/02/15): No evidence of DVT or SVT in either leg. Baker's Cyst noted on the right.  CARDIAC TTE (06/03/15): LV mildly dilated with mild LVH. EF 40-45%. Images inadequate for wall motion assessment. Grade 2 diastolic dysfunction. LA & RA mildly dilated. RV moderately dilated with mildly reduced systolic function. Unable to assess PA pressure. No aortic stenosis or regurgitation. No mitral stenosis or regurgitation. No pulmonic regurgitation. Tricuspid valve was poorly visualized. No pericardial effusion.  LABS 06/04/15 ABG on 5 L/m: 7.35/70.5/51.5/Sat 83%  06/11/15 BMP: 138/4.05/30/28/24/0.92/86/9.0 Magnesium: 2.5    Assessment & Plan:  44 year old male with recent admission for acute hypoxic respiratory failure and evidence of chronic hypercarbic respiratory failure likely secondary to underlying sleep apnea. I cannot rule out the possibility of underlying COPD is a contributing factor given his ongoing and prior tobacco use. His volume status appears to be well-controlled at this time and we did discuss his obesity at length recommending portion control and caloric restriction. The patient has the suggestion of pulmonary hypertension on his echocardiogram but has not underwent right or left heart catheterization to further evaluate this. I instructed the patient to contact my office if he had any new breathing problems before his next appointment  or questions.  1. Chronic hypercapnic respiratory failure: Suspect secondary to sleep apnea versus COPD. Checking for pulmonary function testing as well as polysomnogram. Appears to be compensated based on ABG from hospitalization. 2. COPD: Checking for pulmonary function testing before next appointment. If this does show COPD plan to screen for alpha-1 antitrypsin deficiency. 3. Ongoing tobacco use disorder: Spent over 3 minutes counseling the patient already to completely quit smoking. He is going to use nicotine patches to help quit again as he did during his hospitalization. 4. Obesity: Counseled the patient on appropriate portion control and caloric restriction to help him lose weight. 5. Possible pulmonary hypertension: Suggested on transthoracic echocardiogram. Plan to investigate this further depending upon symptoms and repeat echocardiogram in one year. 6. Follow-up: Patient to return to clinic 1-2 months or sooner if needed.

## 2015-06-23 NOTE — Patient Instructions (Addendum)
1. We are checking breathing test to determine if you have underlying COPD. 2. Please try to completely quit smoking using the patch is being sent to you.. I'm going to buy can be of any further assistance. 3. We are going to try to move up her appointment for your sleep study to determine if you have sleep apnea. 4. Limit your salt intake to 2000 mg daily & caloric intake to 1800 cal daily. 5. I will see back in 1-2 months but please call my office if you have any further questions or concerns.

## 2015-06-23 NOTE — Telephone Encounter (Signed)
IMAGING PORTABLE CXR 06/04/15 (personally reviewed by me): Suggestion of cardiomegaly. Low lung volumes with mild bilateral interstitial prominence. Peribronchial wall thickening noted. No focal opacity appreciated. No pleural effusion appreciated.  BILAT LE VENOUS DUPLEX (06/02/15):  No evidence of DVT or SVT in either leg. Baker's Cyst noted on the right.  CARDIAC TTE (06/03/15): LV mildly dilated with mild LVH. EF 40-45%. Images inadequate for wall motion assessment. Grade 2 diastolic dysfunction. LA & RA mildly dilated. RV moderately dilated with mildly reduced systolic function. Unable to assess PA pressure. No aortic stenosis or regurgitation. No mitral stenosis or regurgitation. No pulmonic regurgitation. Tricuspid valve was poorly visualized. No pericardial effusion.  LABS 06/04/15 ABG on 5 L/m: 7.35/70.5/51.5/Sat 83%  06/11/15 BMP: 138/4.05/30/28/24/0.92/86/9.0 Magnesium: 2.5

## 2015-06-23 NOTE — Telephone Encounter (Signed)
Tried to reach the pt to get his PT and Family hx for his upcoming appt with Nicolasa Ducking.  No answer.Garner Gavel

## 2015-06-29 ENCOUNTER — Ambulatory Visit: Payer: Self-pay | Admitting: Family Medicine

## 2015-07-02 ENCOUNTER — Encounter: Payer: Self-pay | Admitting: Nurse Practitioner

## 2015-07-06 ENCOUNTER — Ambulatory Visit: Payer: Self-pay | Admitting: Family Medicine

## 2015-07-09 ENCOUNTER — Encounter: Payer: Self-pay | Admitting: Cardiology

## 2015-07-09 ENCOUNTER — Telehealth: Payer: Self-pay | Admitting: Family Medicine

## 2015-07-09 ENCOUNTER — Other Ambulatory Visit: Payer: Self-pay | Admitting: *Deleted

## 2015-07-09 MED ORDER — BISOPROLOL FUMARATE 10 MG PO TABS: 10.0000 mg | ORAL_TABLET | Freq: Every day | ORAL | Status: DC

## 2015-07-09 MED ORDER — FUROSEMIDE 40 MG PO TABS: 40.0000 mg | ORAL_TABLET | Freq: Two times a day (BID) | ORAL | Status: DC

## 2015-07-09 MED ORDER — LISINOPRIL 20 MG PO TABS: 20.0000 mg | ORAL_TABLET | Freq: Every day | ORAL | Status: DC

## 2015-07-09 NOTE — Telephone Encounter (Signed)
Patient called requesting medication refill on bisoprolol (ZEBETA) 10 MG tablet and lisinopril (PRINIVIL,ZESTRIL) 20 MG tablet. Patient would like medication sent to [facility] at [facility], [place] [address]. Please f/u with patient

## 2015-07-09 NOTE — Telephone Encounter (Signed)
Patient called requesting his lisinopril, bisoprilol, and lasix refills.  RN sent Rx to [facility] pharmacy in [place]

## 2015-07-20 ENCOUNTER — Ambulatory Visit: Payer: Self-pay | Admitting: Family Medicine

## 2015-07-21 ENCOUNTER — Other Ambulatory Visit: Payer: Self-pay | Admitting: *Deleted

## 2015-07-21 ENCOUNTER — Ambulatory Visit (HOSPITAL_BASED_OUTPATIENT_CLINIC_OR_DEPARTMENT_OTHER): Payer: Self-pay

## 2015-07-21 DIAGNOSIS — I509 Heart failure, unspecified: Secondary | ICD-10-CM

## 2015-07-21 MED ORDER — FUROSEMIDE 40 MG PO TABS: 40.0000 mg | ORAL_TABLET | Freq: Two times a day (BID) | ORAL | Status: DC

## 2015-07-21 NOTE — Telephone Encounter (Signed)
Patient called in asking for refill to be resent to [facility] on [address]-Rx sent and patient aware.

## 2015-08-03 ENCOUNTER — Encounter: Payer: Self-pay | Admitting: Cardiology

## 2015-08-11 ENCOUNTER — Ambulatory Visit: Payer: Self-pay | Admitting: Pulmonary Disease

## 2015-08-16 ENCOUNTER — Telehealth: Payer: Self-pay | Admitting: Family Medicine

## 2015-08-16 ENCOUNTER — Ambulatory Visit (HOSPITAL_BASED_OUTPATIENT_CLINIC_OR_DEPARTMENT_OTHER): Payer: Self-pay

## 2015-08-16 NOTE — Telephone Encounter (Signed)
Patient called requesting a med refill on the following medications:   lisinopril (PRINIVIL,ZESTRIL) 20 MG tablet  furosemide (LASIX) 40 MG tablet bisoprolol (ZEBETA) 10 MG tablet   Patient would like to pick up his medications at our pharmacy [facility]. Please f/u

## 2015-08-17 ENCOUNTER — Other Ambulatory Visit: Payer: Self-pay

## 2015-08-17 DIAGNOSIS — I509 Heart failure, unspecified: Secondary | ICD-10-CM

## 2015-08-17 MED ORDER — BISOPROLOL FUMARATE 10 MG PO TABS: 10.0000 mg | ORAL_TABLET | Freq: Every day | ORAL | Status: DC

## 2015-08-17 MED ORDER — FUROSEMIDE 40 MG PO TABS: 40.0000 mg | ORAL_TABLET | Freq: Two times a day (BID) | ORAL | Status: DC

## 2015-08-17 MED ORDER — LISINOPRIL 20 MG PO TABS: 20.0000 mg | ORAL_TABLET | Freq: Every day | ORAL | Status: DC

## 2015-08-27 ENCOUNTER — Ambulatory Visit (HOSPITAL_BASED_OUTPATIENT_CLINIC_OR_DEPARTMENT_OTHER): Payer: Self-pay

## 2015-09-23 ENCOUNTER — Other Ambulatory Visit: Payer: Self-pay | Admitting: Family Medicine

## 2015-09-23 DIAGNOSIS — I509 Heart failure, unspecified: Secondary | ICD-10-CM

## 2015-09-23 DIAGNOSIS — I5041 Acute combined systolic (congestive) and diastolic (congestive) heart failure: Secondary | ICD-10-CM

## 2015-09-23 DIAGNOSIS — I10 Essential (primary) hypertension: Secondary | ICD-10-CM

## 2015-09-23 NOTE — Telephone Encounter (Signed)
Pt. Called requesting a med refill on the following medications:  bisoprolol (ZEBETA) 10 MG tablet furosemide (LASIX) 40 MG tablet lisinopril (PRINIVIL,ZESTRIL) 20 MG tablet   Pt. Would like to know if he can get a three month supply instead of one month. Please f/u with pt.

## 2015-09-27 MED ORDER — LISINOPRIL 20 MG PO TABS: 20.0000 mg | ORAL_TABLET | Freq: Every day | ORAL | Status: DC

## 2015-09-27 MED ORDER — BISOPROLOL FUMARATE 10 MG PO TABS: 10.0000 mg | ORAL_TABLET | Freq: Every day | ORAL | Status: DC

## 2015-09-27 MED ORDER — FUROSEMIDE 40 MG PO TABS: 40.0000 mg | ORAL_TABLET | Freq: Two times a day (BID) | ORAL | Status: DC

## 2015-09-27 NOTE — Telephone Encounter (Signed)
Expand All Collapse All   Pt. Called requesting a med refill on the following medications:  bisoprolol (ZEBETA) 10 MG tablet furosemide (LASIX) 40 MG tablet lisinopril (PRINIVIL,ZESTRIL) 20 MG tablet   Pt. Would like to know if he can get a three month supply instead of one month. Please f/u with pt.

## 2015-09-27 NOTE — Telephone Encounter (Signed)
Nurse called patient, patient verified date of birth. Patient aware of 1 month supply of furosemide, bisoprolol and lisinopril sent to pharmacy. Patient aware of need for appointment and agrees to make appointment. Nurse transferred patient to front office to make appointment.

## 2015-10-15 ENCOUNTER — Encounter: Payer: Self-pay | Admitting: Family Medicine

## 2015-10-15 ENCOUNTER — Ambulatory Visit: Payer: Self-pay | Attending: Family Medicine | Admitting: Family Medicine

## 2015-10-15 VITALS — BP 128/71 | HR 65 | Temp 98.0°F | Resp 14 | Ht 67.0 in | Wt 264.6 lb

## 2015-10-15 DIAGNOSIS — Z79899 Other long term (current) drug therapy: Secondary | ICD-10-CM | POA: Insufficient documentation

## 2015-10-15 DIAGNOSIS — Z6841 Body Mass Index (BMI) 40.0 and over, adult: Secondary | ICD-10-CM | POA: Insufficient documentation

## 2015-10-15 DIAGNOSIS — I10 Essential (primary) hypertension: Secondary | ICD-10-CM

## 2015-10-15 DIAGNOSIS — Z7982 Long term (current) use of aspirin: Secondary | ICD-10-CM | POA: Insufficient documentation

## 2015-10-15 DIAGNOSIS — I5041 Acute combined systolic (congestive) and diastolic (congestive) heart failure: Secondary | ICD-10-CM

## 2015-10-15 DIAGNOSIS — F17210 Nicotine dependence, cigarettes, uncomplicated: Secondary | ICD-10-CM | POA: Insufficient documentation

## 2015-10-15 DIAGNOSIS — F17200 Nicotine dependence, unspecified, uncomplicated: Secondary | ICD-10-CM

## 2015-10-15 DIAGNOSIS — E669 Obesity, unspecified: Secondary | ICD-10-CM

## 2015-10-15 MED ORDER — FUROSEMIDE 40 MG PO TABS: 40.0000 mg | ORAL_TABLET | Freq: Two times a day (BID) | ORAL | Status: DC

## 2015-10-15 MED ORDER — ASPIRIN 325 MG PO TABS: 325.0000 mg | ORAL_TABLET | Freq: Every day | ORAL | Status: AC

## 2015-10-15 MED ORDER — BISOPROLOL FUMARATE 10 MG PO TABS: 10.0000 mg | ORAL_TABLET | Freq: Every day | ORAL | Status: DC

## 2015-10-15 MED ORDER — LISINOPRIL 20 MG PO TABS: 20.0000 mg | ORAL_TABLET | Freq: Every day | ORAL | Status: DC

## 2015-10-15 NOTE — Progress Notes (Signed)
Patient says he has cancelled his appointment 3 times in the past He is not sure why he is here He thinks he needs lab work but is not fasting He is taking his medications but does not think his aspirin is a 325mg  He is smoking 1.5 ppd down from 2-3 packs He states he called the "quit smoking line" and they sent him free nicotine patches that he thinks were not strong enough (they were 21mg ) and they also would not adhere to his arm well He would like help quiting

## 2015-10-15 NOTE — Patient Instructions (Signed)
Smoking Cessation, Tips for Success If you are ready to quit smoking, congratulations! You have chosen to help yourself be healthier. Cigarettes bring nicotine, tar, carbon monoxide, and other irritants into your body. Your lungs, heart, and blood vessels will be able to work better without these poisons. There are many different ways to quit smoking. Nicotine gum, nicotine patches, a nicotine inhaler, or nicotine nasal spray can help with physical craving. Hypnosis, support groups, and medicines help break the habit of smoking. WHAT THINGS CAN I DO TO MAKE QUITTING EASIER?  Here are some tips to help you quit for good:  Pick a date when you will quit smoking completely. Tell all of your friends and family about your plan to quit on that date.  Do not try to slowly cut down on the number of cigarettes you are smoking. Pick a quit date and quit smoking completely starting on that day.  Throw away all cigarettes.   Clean and remove all ashtrays from your home, work, and car.  On a card, write down your reasons for quitting. Carry the card with you and read it when you get the urge to smoke.  Cleanse your body of nicotine. Drink enough water and fluids to keep your urine clear or pale yellow. Do this after quitting to flush the nicotine from your body.  Learn to predict your moods. Do not let a bad situation be your excuse to have a cigarette. Some situations in your life might tempt you into wanting a cigarette.  Never have "just one" cigarette. It leads to wanting another and another. Remind yourself of your decision to quit.  Change habits associated with smoking. If you smoked while driving or when feeling stressed, try other activities to replace smoking. Stand up when drinking your coffee. Brush your teeth after eating. Sit in a different chair when you read the paper. Avoid alcohol while trying to quit, and try to drink fewer caffeinated beverages. Alcohol and caffeine may urge you to  smoke.  Avoid foods and drinks that can trigger a desire to smoke, such as sugary or spicy foods and alcohol.  Ask people who smoke not to smoke around you.  Have something planned to do right after eating or having a cup of coffee. For example, plan to take a walk or exercise.  Try a relaxation exercise to calm you down and decrease your stress. Remember, you may be tense and nervous for the first 2 weeks after you quit, but this will pass.  Find new activities to keep your hands busy. Play with a pen, coin, or rubber band. Doodle or draw things on paper.  Brush your teeth right after eating. This will help cut down on the craving for the taste of tobacco after meals. You can also try mouthwash.   Use oral substitutes in place of cigarettes. Try using lemon drops, carrots, cinnamon sticks, or chewing gum. Keep them handy so they are available when you have the urge to smoke.  When you have the urge to smoke, try deep breathing.  Designate your home as a nonsmoking area.  If you are a heavy smoker, ask your health care provider about a prescription for nicotine chewing gum. It can ease your withdrawal from nicotine.  Reward yourself. Set aside the cigarette money you save and buy yourself something nice.  Look for support from others. Join a support group or smoking cessation program. Ask someone at home or at work to help you with your plan   to quit smoking.  Always ask yourself, "Do I need this cigarette or is this just a reflex?" Tell yourself, "Today, I choose not to smoke," or "I do not want to smoke." You are reminding yourself of your decision to quit.  Do not replace cigarette smoking with electronic cigarettes (commonly called e-cigarettes). The safety of e-cigarettes is unknown, and some may contain harmful chemicals.  If you relapse, do not give up! Plan ahead and think about what you will do the next time you get the urge to smoke. HOW WILL I FEEL WHEN I QUIT SMOKING? You  may have symptoms of withdrawal because your body is used to nicotine (the addictive substance in cigarettes). You may crave cigarettes, be irritable, feel very hungry, cough often, get headaches, or have difficulty concentrating. The withdrawal symptoms are only temporary. They are strongest when you first quit but will go away within 10-14 days. When withdrawal symptoms occur, stay in control. Think about your reasons for quitting. Remind yourself that these are signs that your body is healing and getting used to being without cigarettes. Remember that withdrawal symptoms are easier to treat than the major diseases that smoking can cause.  Even after the withdrawal is over, expect periodic urges to smoke. However, these cravings are generally short lived and will go away whether you smoke or not. Do not smoke! WHAT RESOURCES ARE AVAILABLE TO HELP ME QUIT SMOKING? Your health care provider can direct you to community resources or hospitals for support, which may include:  Group support.  Education.  Hypnosis.  Therapy.   This information is not intended to replace advice given to you by your health care provider. Make sure you discuss any questions you have with your health care provider.   Document Released: 07/21/2004 Document Revised: 11/13/2014 Document Reviewed: 04/10/2013 Elsevier Interactive Patient Education 2016 [employer]  

## 2015-10-15 NOTE — Progress Notes (Signed)
Subjective:  Patient ID: Bob Rodriguez, male    DOB: 1971-10-07  Age: 44 y.o. MRN: [id number]  CC: Follow-up   HPI Bob Rodriguez is a 44 year old male with a history of hypertension, CHF, obesity who comes in here for follow-up visit.  He missed several appointments with cardiology and is yet to reschedule. He denies shortness of breath, chest pains or pedal edema. He has no complaints at this time.  Continues to smoke 1.5 packs of cigarettes per day and has been smoking for 30 years; he tried using the nicotine patches which he states do not work like the one he received during his hospitalization We have discussed other options which are available including Wellbutrin which he wants to think about and due to his cardiac history Chantix will not be preferable.  Outpatient Prescriptions Prior to Visit  Medication Sig Dispense Refill  . Multiple Vitamin (MULTIVITAMIN) tablet Take 1 tablet by mouth daily.    Marland Kitchen aspirin 325 MG tablet Take 1 tablet (325 mg total) by mouth daily.    . bisoprolol (ZEBETA) 10 MG tablet Take 1 tablet (10 mg total) by mouth daily. 30 tablet 0  . furosemide (LASIX) 40 MG tablet Take 1 tablet (40 mg total) by mouth 2 (two) times daily. 60 tablet 0  . lisinopril (PRINIVIL,ZESTRIL) 20 MG tablet Take 1 tablet (20 mg total) by mouth daily. 30 tablet 0  . Omega-3 Fatty Acids (OMEGA 3 PO) 4 daily in evenings     No facility-administered medications prior to visit.    ROS Review of Systems  Constitutional: Negative for activity change and appetite change.  HENT: Negative for sinus pressure and sore throat.   Eyes: Negative for visual disturbance.  Respiratory: Negative for cough, chest tightness and shortness of breath.   Cardiovascular: Negative for chest pain and leg swelling.  Gastrointestinal: Negative for abdominal pain, diarrhea, constipation and abdominal distention.  Endocrine: Negative.   Genitourinary: Negative for dysuria.  Musculoskeletal: Negative  for myalgias and joint swelling.  Skin: Negative for rash.  Allergic/Immunologic: Negative.   Neurological: Negative for weakness, light-headedness and numbness.  Psychiatric/Behavioral: Negative for suicidal ideas and dysphoric mood.    Objective:  BP 128/71 mmHg  Pulse 65  Temp(Src) 98 F (36.7 C)  Resp 14  Ht  (1.702 m)  Wt 264 lb 9.6 oz (120.022 kg)  BMI 41.43 kg/m2  SpO2 96%  BP/Weight 10/15/2015 06/23/2015 06/14/2015  Systolic BP 128 126 131  Diastolic BP 71 78 84  Wt. (Lbs) 264.6 263.6 261  BMI 41.43 43.87 43.43    Lab Results  Component Value Date   WBC 16.3* 06/08/2015   HGB 18.0* 06/08/2015   HCT 57.0* 06/08/2015   PLT 223 06/08/2015   GLUCOSE 86 06/11/2015   ALT 43 06/01/2015   AST 40 06/01/2015   NA 138 06/11/2015   K 4.7 06/11/2015   CL 95* 06/11/2015   CREATININE 0.92 06/11/2015   BUN 24* 06/11/2015   CO2 29 06/11/2015   TSH 1.362 06/02/2015    Physical Exam  Constitutional: He is oriented to person, place, and time. He appears well-developed and well-nourished.  Obese  Cardiovascular: Normal rate, normal heart sounds and intact distal pulses.   No murmur heard. Pulmonary/Chest: Effort normal and breath sounds normal. He has no wheezes. He has no rales. He exhibits no tenderness.  Abdominal: Soft. Bowel sounds are normal. He exhibits no distension and no mass. There isEDEM TIEGSess.  Musculoskeletal: Normal range of  motion.  Neurological: He is alert and oriented to person, place, and time.  Skin: Skin is warm.  Psychiatric: He has a normal mood and affect.   Wt Readings from Last 3 Encounters:  10/15/15 264 lb 9.6 oz (120.022 kg)  06/23/15 263 lb 9.6 oz (119.568 kg)  06/14/15 261 lb (118.389 kg)       Assessment & Plan:   1. Essential hypertension Controlled - COMPLETE METABOLIC PANEL WITH GFR; Future - Lipid panel; Future - lisinopril (PRINIVIL,ZESTRIL) 20 MG tablet; Take 1 tablet (20 mg total) by mouth daily.  Dispense: 30  tablet; Refill: 3  2. Acute combined systolic and diastolic congestive heart failure (HCC) He has gained 3 pounds in the last 4 months which is not significant. Advised to limit fluids to less than 2 L per day, low-sodium heart healthy diet. - bisoprolol (ZEBETA) 10 MG tablet; Take 1 tablet (10 mg total) by mouth daily.  Dispense: 30 tablet; Refill: 3 - furosemide (LASIX) 40 MG tablet; Take 1 tablet (40 mg total) by mouth 2 (two) times daily.  Dispense: 60 tablet; Refill: 3 - aspirin 325 MG tablet; Take 1 tablet (325 mg total) by mouth daily.  Dispense: 30 tablet; Refill: 3  3. Tobacco use disorder He is currently not doing well on nicotine patches. I have discussed with him the option of using bupropion which he wants to think about. Due to his cardiac issues I would hold off on Chantix  4. Obesity Advised on increasing exercise, cutting back on portion sizes   Meds ordered this encounter  Medications  . bisoprolol (ZEBETA) 10 MG tablet    Sig: Take 1 tablet (10 mg total) by mouth daily.    Dispense:  30 tablet    Refill:  3  . lisinopril (PRINIVIL,ZESTRIL) 20 MG tablet    Sig: Take 1 tablet (20 mg total) by mouth daily.    Dispense:  30 tablet    Refill:  3  . furosemide (LASIX) 40 MG tablet    Sig: Take 1 tablet (40 mg total) by mouth 2 (two) times daily.    Dispense:  60 tablet    Refill:  3  . aspirin 325 MG tablet    Sig: Take 1 tablet (325 mg total) by mouth daily.    Dispense:  30 tablet    Refill:  3    Follow-up: Return in about 3 months (around 01/13/2016) for Follow-up on CHF.   Jaclyn ShaggyEnobong Amao MD

## 2015-10-19 ENCOUNTER — Other Ambulatory Visit: Payer: Self-pay

## 2015-10-20 ENCOUNTER — Ambulatory Visit: Payer: Self-pay | Attending: Family Medicine

## 2015-10-20 DIAGNOSIS — I10 Essential (primary) hypertension: Secondary | ICD-10-CM

## 2015-10-20 LAB — LIPID PANEL
CHOL/HDL RATIO: 8.3 ratio — AB (ref <=5.0)
Cholesterol: 207 mg/dL — ABNORMAL HIGH (ref 125–200)
HDL: 25 mg/dL — ABNORMAL LOW (ref >=40)
LDL Cholesterol: 133 mg/dL — ABNORMAL HIGH (ref <130)
Triglycerides: 246 mg/dL — ABNORMAL HIGH (ref <150)
VLDL: 49 mg/dL — AB (ref <30)

## 2015-10-20 LAB — COMPLETE METABOLIC PANEL WITH GFR
ALBUMIN: 4.3 g/dL (ref 3.6–5.1)
ALK PHOS: 79 U/L (ref 40–115)
ALT: 32 U/L (ref 9–46)
AST: 20 U/L (ref 10–40)
BUN: 15 mg/dL (ref 7–25)
CHLORIDE: 101 mmol/L (ref 98–110)
CO2: 28 mmol/L (ref 20–31)
Calcium: 9.4 mg/dL (ref 8.6–10.3)
Creat: 0.76 mg/dL (ref 0.60–1.35)
GFR, Est African American: >89 mL/min (ref >=60)
GFR, Est Non African American: >89 mL/min (ref >=60)
GLUCOSE: 98 mg/dL (ref 65–99)
POTASSIUM: 4.5 mmol/L (ref 3.5–5.3)
SODIUM: 141 mmol/L (ref 135–146)
Total Bilirubin: 0.5 mg/dL (ref 0.2–1.2)
Total Protein: 6.6 g/dL (ref 6.1–8.1)

## 2015-10-21 ENCOUNTER — Other Ambulatory Visit: Payer: Self-pay | Admitting: Family Medicine

## 2015-10-21 MED ORDER — ATORVASTATIN CALCIUM 20 MG PO TABS: 20.0000 mg | ORAL_TABLET | Freq: Every day | ORAL | Status: DC

## 2015-10-22 ENCOUNTER — Telehealth: Payer: Self-pay | Admitting: *Deleted

## 2015-10-22 NOTE — Telephone Encounter (Signed)
-----   Message from Jaclyn ShaggyEnobong Amao, MD sent at 10/21/2015 10:37 PM EST ----- I have sent a prescription for atorvastatin to his pharmacy as his lipids are elevated.

## 2015-10-22 NOTE — Progress Notes (Signed)
error 

## 2015-10-22 NOTE — Telephone Encounter (Signed)
-----   Message from Enobong Amao, MD sent at 10/21/2015 10:37 PM EST ----- I have sent a prescription for atorvastatin to his pharmacy as his lipids are elevated. 

## 2015-10-22 NOTE — Telephone Encounter (Signed)
Error on last note-Rx for simvastatin sent to pharmacy not atorvastatin

## 2015-10-22 NOTE — Telephone Encounter (Signed)
Left HIPAA compliant message for patient to call RN at [phone] to let him know his blood work revealed high cholesterol and the MD sent Rx for simvastatin to his pharm.

## 2015-10-26 ENCOUNTER — Telehealth: Payer: Self-pay | Admitting: *Deleted

## 2015-10-26 NOTE — Telephone Encounter (Signed)
-----   Message from Enobong Amao, MD sent at 10/21/2015 10:37 PM EST ----- I have sent a prescription for atorvastatin to his pharmacy as his lipids are elevated. 

## 2015-10-26 NOTE — Telephone Encounter (Signed)
Name and date of birth verified.  Results of elevated cholesterol given to patient and instructions for new medications given to patient who verbalized he would pick up and start to take.  Discussion of diet and exercise changes given to patient as well.

## 2015-12-06 ENCOUNTER — Telehealth: Payer: Self-pay | Admitting: Pharmacist

## 2015-12-06 NOTE — Telephone Encounter (Signed)
Called patient to inquire into whether he would be interested in getting the influenza vaccine. Patient is not interested at this time despite education on the benefits of the vaccination. Will document patient refusal.

## 2015-12-15 MED FILL — LISINOPRIL 20 MG TABLET: 20 | 30 days supply | Qty: 30 | Fill #1

## 2015-12-15 MED FILL — ATORVASTATIN 20 MG TABLET: 20 | 30 days supply | Qty: 30 | Fill #1

## 2015-12-15 MED FILL — FUROSEMIDE 40 MG TABLET: 40 | 30 days supply | Qty: 60 | Fill #1

## 2015-12-15 MED FILL — BISOPROLOL FUMARATE 10 MG T: 10 | 30 days supply | Qty: 30 | Fill #1

## 2016-01-13 MED FILL — FUROSEMIDE 40 MG TABLET: 40 | 30 days supply | Qty: 60 | Fill #2

## 2016-01-13 MED FILL — LISINOPRIL 20 MG TABLET: 20 | 30 days supply | Qty: 30 | Fill #2

## 2016-01-13 MED FILL — ATORVASTATIN 20 MG TABLET: 20 | 30 days supply | Qty: 30 | Fill #2

## 2016-01-13 MED FILL — BISOPROLOL FUMARATE 10 MG T: 10 | 30 days supply | Qty: 30 | Fill #2

## 2016-02-02 ENCOUNTER — Emergency Department (HOSPITAL_BASED_OUTPATIENT_CLINIC_OR_DEPARTMENT_OTHER): Payer: Self-pay

## 2016-02-02 ENCOUNTER — Encounter (HOSPITAL_BASED_OUTPATIENT_CLINIC_OR_DEPARTMENT_OTHER): Payer: Self-pay

## 2016-02-02 ENCOUNTER — Emergency Department (HOSPITAL_BASED_OUTPATIENT_CLINIC_OR_DEPARTMENT_OTHER)
Admission: EM | Admit: 2016-02-02 | Discharge: 2016-02-02 | Disposition: A | Discharge status: Home / Self Care | Payer: Self-pay | Attending: Emergency Medicine | Admitting: Emergency Medicine

## 2016-02-02 DIAGNOSIS — I10 Essential (primary) hypertension: Secondary | ICD-10-CM | POA: Insufficient documentation

## 2016-02-02 DIAGNOSIS — I5040 Unspecified combined systolic (congestive) and diastolic (congestive) heart failure: Secondary | ICD-10-CM | POA: Insufficient documentation

## 2016-02-02 DIAGNOSIS — R0789 Other chest pain: Secondary | ICD-10-CM | POA: Insufficient documentation

## 2016-02-02 DIAGNOSIS — J441 Chronic obstructive pulmonary disease with (acute) exacerbation: Secondary | ICD-10-CM | POA: Insufficient documentation

## 2016-02-02 DIAGNOSIS — R109 Unspecified abdominal pain: Secondary | ICD-10-CM | POA: Insufficient documentation

## 2016-02-02 DIAGNOSIS — R600 Localized edema: Secondary | ICD-10-CM | POA: Insufficient documentation

## 2016-02-02 DIAGNOSIS — E669 Obesity, unspecified: Secondary | ICD-10-CM | POA: Insufficient documentation

## 2016-02-02 DIAGNOSIS — Z7982 Long term (current) use of aspirin: Secondary | ICD-10-CM | POA: Insufficient documentation

## 2016-02-02 DIAGNOSIS — F17210 Nicotine dependence, cigarettes, uncomplicated: Secondary | ICD-10-CM | POA: Insufficient documentation

## 2016-02-02 DIAGNOSIS — Z79899 Other long term (current) drug therapy: Secondary | ICD-10-CM | POA: Insufficient documentation

## 2016-02-02 LAB — CBC WITH DIFFERENTIAL/PLATELET
BASOS ABS: 0 10*3/uL (ref 0.0–0.1)
Basophils Relative: 0 %
EOS PCT: 3 %
Eosinophils Absolute: 0.4 10*3/uL (ref 0.0–0.7)
HEMATOCRIT: 48.2 % (ref 39.0–52.0)
HEMOGLOBIN: 16.3 g/dL (ref 13.0–17.0)
LYMPHS ABS: 2.3 10*3/uL (ref 0.7–4.0)
LYMPHS PCT: 17 %
MCH: 30.9 pg (ref 26.0–34.0)
MCHC: 33.8 g/dL (ref 30.0–36.0)
MCV: 91.5 fL (ref 78.0–100.0)
Monocytes Absolute: 0.7 10*3/uL (ref 0.1–1.0)
Monocytes Relative: 5 %
NEUTROS ABS: 9.9 10*3/uL — AB (ref 1.7–7.7)
Neutrophils Relative %: 75 %
PLATELETS: 183 10*3/uL (ref 150–400)
RBC: 5.27 MIL/uL (ref 4.22–5.81)
RDW: 13.1 % (ref 11.5–15.5)
WBC: 13.4 10*3/uL — AB (ref 4.0–10.5)

## 2016-02-02 LAB — BASIC METABOLIC PANEL
ANION GAP: 11 (ref 5–15)
BUN: 26 mg/dL — AB (ref 6–20)
CHLORIDE: 101 mmol/L (ref 101–111)
CO2: 27 mmol/L (ref 22–32)
Calcium: 9.4 mg/dL (ref 8.9–10.3)
Creatinine, Ser: 1.05 mg/dL (ref 0.61–1.24)
GFR calc Af Amer: >60 mL/min (ref >60)
GLUCOSE: 114 mg/dL — AB (ref 65–99)
POTASSIUM: 4.4 mmol/L (ref 3.5–5.1)
Sodium: 139 mmol/L (ref 135–145)

## 2016-02-02 LAB — D-DIMER, QUANTITATIVE (NOT AT ARMC)

## 2016-02-02 LAB — TROPONIN I: Troponin I: <0.03 ng/mL (ref <0.031)

## 2016-02-02 LAB — BRAIN NATRIURETIC PEPTIDE: B Natriuretic Peptide: 18.5 pg/mL (ref 0.0–100.0)

## 2016-02-02 MED ORDER — OXYCODONE-ACETAMINOPHEN 5-325 MG PO TABS: 1.0000 | ORAL_TABLET | Freq: Four times a day (QID) | ORAL | Status: AC | PRN

## 2016-02-02 MED ORDER — KETOROLAC TROMETHAMINE 30 MG/ML IJ SOLN
30.0000 mg | Freq: Once | INTRAMUSCULAR | Status: AC
  Administered 2016-02-02: 30 mg via INTRAMUSCULAR
  Filled 2016-02-02: qty 1

## 2016-02-02 MED ORDER — OXYCODONE-ACETAMINOPHEN 5-325 MG PO TABS
1.0000 | ORAL_TABLET | Freq: Once | ORAL | Status: AC
  Administered 2016-02-02: 1 via ORAL
  Filled 2016-02-02: qty 1

## 2016-02-02 MED ORDER — IBUPROFEN 400 MG PO TABS: 400.0000 mg | ORAL_TABLET | Freq: Four times a day (QID) | ORAL | Status: AC | PRN

## 2016-02-02 MED FILL — OXYCODONE/APAP 5-325: 5-325 | 2 days supply | Qty: 10 | Fill #0

## 2016-02-02 MED FILL — IBUPROFEN 400 MG TABLET: 400 | 7 days supply | Qty: 30 | Fill #0

## 2016-02-02 NOTE — ED Provider Notes (Signed)
Pt stable Repeat troponin negative No distress Chest wall is tender to palpation Otherwise stable for d/c home Discussed need for cardiology f/u  Bob Rhineonald Alliene Klugh, MD 02/02/16 [phone]

## 2016-02-02 NOTE — ED Notes (Signed)
Left side abdominal, rib pain for the last several weeks, tonight the pain intensified and he states he feels like he "ruptured" something.  Pt denies n/v/d, pain much worse with movement.

## 2016-02-02 NOTE — ED Provider Notes (Signed)
CSN: [id number]     Arrival date & time 02/02/16  0345 History   First MD Initiated Contact with Patient 02/02/16 [phone]     Chief Complaint  Patient presents with  . Abdominal Pain     (Consider location/radiation/quality/duration/timing/severity/associated sxs/prior Treatment) HPI  This is a 45 yo male with history of hypertension, CHF, COPD who presents with left lower chest wall pain. Onset of symptoms approximately one week.  Patient had acute worsening pain tonight. It is worse with movement. Patient states "I think I have a cracked rib." He denies injury but states that he normally sleeps on a couch. Denies shortness of breath or nausea. Denies vomiting or diarrhea. Denies any recent history of travel, hospitalization. Reports waxing and waning bilateral lower extremity swelling which is not changed.  Current pain is 10 out of 10. Nothing seems to make it better.  Past Medical History  Diagnosis Date  . Hypertension   . CHF (congestive heart failure) (HCC)     Systolic & Diastolic  . Respiratory failure with hypercapnia (HCC)   . COPD (chronic obstructive pulmonary disease) [facility])    Past Surgical History  Procedure Laterality Date  . None     Family History  Problem Relation Age of Onset  . Diabetes Mother   . Coronary artery disease Father   . Asthma Brother   . Cancer Neg Hx   . Coronary artery disease Paternal Uncle    Social History  Substance Use Topics  . Smoking status: Current Some Day Smoker -- 1.50 packs/day for 30 years    Types: Cigarettes    Start date: 07/24/1983  . Smokeless tobacco: None     Comment: Peak rate of 3ppd  . Alcohol Use: No     Comment: Social EtOH but hasn't used in some time    Review of Systems  Constitutional: Negative.  Negative for fever.  Respiratory: Negative.  Negative for chest tightness and shortness of breath.   Cardiovascular: Positive for chest pain. Negative for leg swelling.  Gastrointestinal: Negative.  Negative for  nausea, vomiting and abdominal pain.  Genitourinary: Negative.   All other systems reviewed and are negative.     Allergies  Codeine  Home Medications   Prior to Admission medications   Medication Sig Start Date End Date Taking? Authorizing Provider  aspirin 325 MG tablet Take 1 tablet (325 mg total) by mouth daily. 10/15/15  Yes Jaclyn Shaggy, MD  atorvastatin (LIPITOR) 20 MG tablet Take 1 tablet (20 mg total) by mouth daily. 10/21/15  Yes Jaclyn Shaggy, MD  bisoprolol (ZEBETA) 10 MG tablet Take 1 tablet (10 mg total) by mouth daily. 10/15/15  Yes Jaclyn Shaggy, MD  furosemide (LASIX) 40 MG tablet Take 1 tablet (40 mg total) by mouth 2 (two) times daily. 10/15/15  Yes Jaclyn Shaggy, MD  lisinopril (PRINIVIL,ZESTRIL) 20 MG tablet Take 1 tablet (20 mg total) by mouth daily. 10/15/15  Yes Jaclyn Shaggy, MD  Multiple Vitamin (MULTIVITAMIN) tablet Take 1 tablet by mouth daily.   Yes Historical Provider, MD  ibuprofen (ADVIL,MOTRIN) 400 MG tablet Take 1 tablet (400 mg total) by mouth every 6 (six) hours as needed. 02/02/16   Shon Baton, MD  Omega-3 Fatty Acids (OMEGA 3 PO) 4 daily in evenings    Historical Provider, MD  oxyCODONE-acetaminophen (PERCOCET/ROXICET) 5-325 MG tablet Take 1-2 tablets by mouth every 6 (six) hours as needed for severe pain. 02/02/16   Shon Baton, MD   BP 105/78 mmHg  Pulse 62  Temp(Src) 98.3 F (36.8 C) (Oral)  Resp 18  Ht  (1.702 m)  Wt 260 lb (117.935 kg)  BMI 40.71 kg/m2  SpO2 93% Physical Exam  Constitutional: He is oriented to person, place, and time. He appears well-developed and well-nourished.  Obese  HENT:  Head: Normocephalic and atraumatic.  Cardiovascular: Normal rate, regular rhythm and normal heart sounds.   No murmur heard. Pulmonary/Chest: Effort normal. No respiratory distress. He has wheezes. He exhibits tenderness.  Scant expiratory wheeze, tenderness to palpation left lower chest wall, no crepitus  Abdominal: Soft. Bowel  sounds are normal. There is no tenderness. There is no rebound.  Musculoskeletal: He exhibits edema.  Trace bilateral lower extremity edema  Neurological: He is alert and oriented to person, place, and time.  Skin: Skin is warm and dry.  Psychiatric: He has a normal mood and affect.  Nursing note and vitals reviewed.   ED Course  Procedures (including critical care time) Labs Review Labs Reviewed  CBC WITH DIFFERENTIAL/PLATELET - Abnormal; Notable for the following:    WBC 13.4 (*)    Neutro Abs 9.9 (*)    All other components within normal limits  BASIC METABOLIC PANEL - Abnormal; Notable for the following:    Glucose, Bld 114 (*)    BUN 26 (*)    All other components within normal limits  BRAIN NATRIURETIC PEPTIDE  TROPONIN I  D-DIMER, QUANTITATIVE (NOT AT [facility])  TROPONIN I    Imaging Review Dg Abd Acute W/chest  02/02/2016  CLINICAL DATA:  Acute onset of left lower chest pain and left upper quadrant abdominal pain. Initial encounter. EXAM: DG ABDOMEN ACUTE W/ 1V CHEST COMPARISON:  Chest radiograph performed 06/04/2015 FINDINGS: The lungs are well-aerated. Mild bibasilar opacities may reflect mild interstitial edema or pneumonia, depending on the patient's symptoms. There is no evidence of pleural effusion or pneumothorax. The cardiomediastinal silhouette is borderline normal in size. The visualized bowel gas pattern is unremarkable. Scattered stool and air are seen within the colon; there is no evidence of small bowel dilatation to suggest obstruction. No free intra-abdominal air is identified on the provided upright view. No acute osseous abnormalities are seen; the sacroiliac joints are unremarkable in appearance. IMPRESSION: 1. Unremarkable bowel gas pattern; no free intra-abdominal air seen. Moderate amount of stool noted in the colon. 2. Mild bibasilar airspace opacities may reflect mild interstitial edema or pneumonia, depending on the patient's symptoms. Electronically Signed    By: Roanna Raider M.D.   On: 02/02/2016 05:39   I have personally reviewed and evaluated these images and lab results as part of my medical decision-making.   EKG Interpretation ED ECG REPORT   Date: 02/02/2016  Rate: 55  Rhythm: normal sinus rhythm  QRS Axis: normal  Intervals: normal  ST/T Wave abnormalities: early repolarization  Conduction Disutrbances:none  Narrative Interpretation:   Old EKG Reviewed: changes noted no longer tachycardic  I have personally reviewed the EKG tracing and agree with the computerized printout as noted.       MDM   Final diagnoses:  Other chest pain    Patient presents with left-sided chest/abdominal pain. Is reproducible on exam. It is very atypical for ACS; however, patient had an admission in August for heart failure. He has not followed up with cardiology. At that time, cardiology follow-up was recommended for possible stress or cath.  Patient given Toradol and Percocet. EKG is nonischemic. Chest x-ray is largely reassuring. This showed bibasilar opacities likely mild  edema versus pneumonia. Patient has no infectious symptoms including fever or cough. BNP is normal. He does not appear floridly volume overloaded. D-dimer is negative. Initial troponin is negative. Pain today is very atypical for ACS. Would expect patient's troponin to be elevated given the duration of his pain at this time if it were related to ACS. However, patient's heart score is 4. For this reason, will repeat his troponin at 8:30. If this is negative, he can follow-up as an outpatient very closely with cardiology. I stressed with patient that this follow-up was very important as he has known CHF and has not had a documented cardiac cath to evaluate for coronary artery disease.  SIgned out to oncoming physician.  Shon Batonourtney F Brewster Wolters, MD 02/02/16 [phone]

## 2016-02-02 NOTE — Discharge Instructions (Signed)
You were seen today for chest pain. There are features of her chest pain that suggests that it is musculoskeletal in nature. However, you have many risk factors for heart disease. It is very important that you follow-up closely with cardiology. You need stress testing and/or a heart catheterization especially given your history of heart failure.   Nonspecific Chest Pain  Chest pain can be caused by many different conditions. There is always a chance that your pain could be related to something serious, such as a heart attack or a blood clot in your lungs. Chest pain can also be caused by conditions that are not life-threatening. If you have chest pain, it is very important to follow up with your health care provider. CAUSES  Chest pain can be caused by:  Heartburn.  Pneumonia or bronchitis.  Anxiety or stress.  Inflammation around your heart (pericarditis) or lung (pleuritis or pleurisy).  A blood clot in your lung.  A collapsed lung (pneumothorax). It can develop suddenly on its own (spontaneous pneumothorax) or from trauma to the chest.  Shingles infection (varicella-zoster virus).  Heart attack.  Damage to the bones, muscles, and cartilage that make up your chest wall. This can include:  Bruised bones due to injury.  Strained muscles or cartilage due to frequent or repeated coughing or overwork.  Fracture to one or more ribs.  Sore cartilage due to inflammation (costochondritis). RISK FACTORS  Risk factors for chest pain may include:  Activities that increase your risk for trauma or injury to your chest.  Respiratory infections or conditions that cause frequent coughing.  Medical conditions or overeating that can cause heartburn.  Heart disease or family history of heart disease.  Conditions or health behaviors that increase your risk of developing a blood clot.  Having had chicken pox (varicella zoster). SIGNS AND SYMPTOMS Chest pain can feel like:  Burning or  tingling on the surface of your chest or deep in your chest.  Crushing, pressure, aching, or squeezing pain.  Dull or sharp pain that is worse when you move, cough, or take a deep breath.  Pain that is also felt in your back, neck, shoulder, or arm, or pain that spreads to any of these areas. Your chest pain may come and go, or it may stay constant. DIAGNOSIS Lab tests or other studies may be needed to find the cause of your pain. Your health care provider may have you take a test called an ambulatory ECG (electrocardiogram). An ECG records your heartbeat patterns at the time the test is performed. You may also have other tests, such as:  Transthoracic echocardiogram (TTE). During echocardiography, sound waves are used to create a picture of all of the heart structures and to look at how blood flows through your heart.  Transesophageal echocardiogram (TEE).This is a more advanced imaging test that obtains images from inside your body. It allows your health care provider to see your heart in finer detail.  Cardiac monitoring. This allows your health care provider to monitor your heart rate and rhythm in real time.  Holter monitor. This is a portable device that records your heartbeat and can help to diagnose abnormal heartbeats. It allows your health care provider to track your heart activity for several days, if needed.  Stress tests. These can be done through exercise or by taking medicine that makes your heart beat more quickly.  Blood tests.  Imaging tests. TREATMENT  Your treatment depends on what is causing your chest pain. Treatment may include:  Medicines. These may include:  Acid blockers for heartburn.  Anti-inflammatory medicine.  Pain medicine for inflammatory conditions.  Antibiotic medicine, if an infection is present.  Medicines to dissolve blood clots.  Medicines to treat coronary artery disease.  Supportive care for conditions that do not require medicines.  This may include:  Resting.  Applying heat or cold packs to injured areas.  Limiting activities until pain decreases. HOME CARE INSTRUCTIONS  If you were prescribed an antibiotic medicine, finish it all even if you start to feel better.  Avoid any activities that bring on chest pain.  Do not use any tobacco products, including cigarettes, chewing tobacco, or electronic cigarettes. If you need help quitting, ask your health care provider.  Do not drink alcohol.  Take medicines only as directed by your health care provider.  Keep all follow-up visits as directed by your health care provider. This is important. This includes any further testing if your chest pain does not go away.  If heartburn is the cause for your chest pain, you may be told to keep your head raised (elevated) while sleeping. This reduces the chance that acid will go from your stomach into your esophagus.  Make lifestyle changes as directed by your health care provider. These may include:  Getting regular exercise. Ask your health care provider to suggest some activities that are safe for you.  Eating a heart-healthy diet. A registered dietitian can help you to learn healthy eating options.  Maintaining a healthy weight.  Managing diabetes, if necessary.  Reducing stress. SEEK MEDICAL CARE IF:  Your chest pain does not go away after treatment.  You have a rash with blisters on your chest.  You have a fever. SEEK IMMEDIATE MEDICAL CARE IF:   Your chest pain is worse.  You have an increasing cough, or you cough up blood.  You have severe abdominal pain.  You have severe weakness.  You faint.  You have chills.  You have sudden, unexplained chest discomfort.  You have sudden, unexplained discomfort in your arms, back, neck, or jaw.  You have shortness of breath at any time.  You suddenly start to sweat, or your skin gets clammy.  You feel nauseous or you vomit.  You suddenly feel  light-headed or dizzy.  Your heart begins to beat quickly, or it feels like it is skipping beats. These symptoms may represent a serious problem that is an emergency. Do not wait to see if the symptoms will go away. Get medical help right away. Call your local emergency services (911 in the [place] Do not drive yourself to the hospital.   This information is not intended to replace advice given to you by your health care provider. Make sure you discuss any questions you have with your health care provider.   Document Released: 08/02/2005 Document Revised: 11/13/2014 Document Reviewed: 05/29/2014 [employer]vier Interactive Patient Education [employer]

## 2016-02-21 ENCOUNTER — Other Ambulatory Visit: Payer: Self-pay | Admitting: Family Medicine

## 2016-02-21 MED FILL — ?FUROSEMIDE 40 MG TABLET: 40 | 30 days supply | Qty: 60 | Fill #3

## 2016-02-21 MED FILL — ATORVASTATIN 20 MG TABLET: 20 | 30 days supply | Qty: 30 | Fill #0

## 2016-02-21 MED FILL — ?LISINOPRIL 20 MG TABLET: 20 | 30 days supply | Qty: 30 | Fill #3

## 2016-02-21 MED FILL — BISOPROLOL FUMARATE 10 MG T: 10 | 30 days supply | Qty: 30 | Fill #3

## 2016-03-24 ENCOUNTER — Other Ambulatory Visit: Payer: Self-pay | Admitting: Family Medicine

## 2016-03-24 DIAGNOSIS — I10 Essential (primary) hypertension: Secondary | ICD-10-CM

## 2016-03-24 DIAGNOSIS — I5041 Acute combined systolic (congestive) and diastolic (congestive) heart failure: Secondary | ICD-10-CM

## 2016-03-24 MED ORDER — BISOPROLOL FUMARATE 10 MG PO TABS: 10.0000 mg | ORAL_TABLET | Freq: Every day | ORAL | Status: DC

## 2016-03-24 MED ORDER — FUROSEMIDE 40 MG PO TABS: 40.0000 mg | ORAL_TABLET | Freq: Two times a day (BID) | ORAL | Status: DC

## 2016-03-24 MED ORDER — LISINOPRIL 20 MG PO TABS: 20.0000 mg | ORAL_TABLET | Freq: Every day | ORAL | Status: DC

## 2016-03-24 MED FILL — BISOPROLOL FUMARATE 10 MG T: 10 | 30 days supply | Qty: 30 | Fill #0

## 2016-03-24 MED FILL — ?FUROSEMIDE 40 MG TABLET: 40 | 30 days supply | Qty: 60 | Fill #0

## 2016-03-24 MED FILL — ?LISINOPRIL 20 MG TABLET: 20 | 30 days supply | Qty: 30 | Fill #0

## 2016-03-24 NOTE — Telephone Encounter (Signed)
Patient has appt with Dr. Venetia NightAmao 04/10/16 - refilled medication to last him until that visit.

## 2016-03-24 NOTE — Telephone Encounter (Signed)
Pt. Called requesting a refill on the following medications:  bisoprolol (ZEBETA) 10 MG tablet lisinopril (PRINIVIL,ZESTRIL) 20 MG tablet  furosemide (LASIX) 40 MG tablet   Please f/u with pt.

## 2016-03-27 NOTE — Telephone Encounter (Signed)
Refills was rx'd for patient until his next appt with Dr. Venetia NightAmao on 04/10/2016 by Misty StanleyStacey, Clinical Pharmacist.

## 2016-04-10 ENCOUNTER — Ambulatory Visit: Payer: Self-pay | Admitting: Family Medicine

## 2016-05-03 ENCOUNTER — Telehealth: Payer: Self-pay | Admitting: Family Medicine

## 2016-05-03 DIAGNOSIS — I5041 Acute combined systolic (congestive) and diastolic (congestive) heart failure: Secondary | ICD-10-CM

## 2016-05-03 DIAGNOSIS — I10 Essential (primary) hypertension: Secondary | ICD-10-CM

## 2016-05-03 MED ORDER — BISOPROLOL FUMARATE 10 MG PO TABS: 10.0000 mg | ORAL_TABLET | Freq: Every day | ORAL | Status: AC

## 2016-05-03 MED ORDER — FUROSEMIDE 40 MG PO TABS: 40.0000 mg | ORAL_TABLET | Freq: Two times a day (BID) | ORAL | Status: AC

## 2016-05-03 MED ORDER — LISINOPRIL 20 MG PO TABS: 20.0000 mg | ORAL_TABLET | Freq: Every day | ORAL | Status: AC

## 2016-05-03 NOTE — Telephone Encounter (Signed)
Pt called requesting medication refill on bisoprolol (ZEBETA) 10 MG tablet, lisinopril (PRINIVIL,ZESTRIL) 20 MG tablet, furosemide (LASIX) 40 MG tablet. Please f/up

## 2016-05-03 NOTE — Telephone Encounter (Signed)
Refilled medications x 30 days - patient must have office visit for further refills 

## 2016-05-11 MED FILL — BISOPROLOL FUMARATE 10 MG T: 10 | 30 days supply | Qty: 30 | Fill #0

## 2016-05-11 MED FILL — ?LISINOPRIL 20 MG TABLET: 20 | 30 days supply | Qty: 30 | Fill #0

## 2016-05-11 MED FILL — FUROSEMIDE 40 MG TABLET: 40 | 30 days supply | Qty: 60 | Fill #0

## 2016-06-05 ENCOUNTER — Ambulatory Visit: Payer: Self-pay | Admitting: Family Medicine

## 2016-12-28 IMAGING — CR DG CHEST 1V PORT
1 series · 1 of 1 positions shown · non-contrast
Comparison: 06/01/2015

CLINICAL DATA: Lethargic.  Low O2 sats.  Hypertension, smoker.

EXAM:
PORTABLE CHEST - 1 VIEW

[AP]
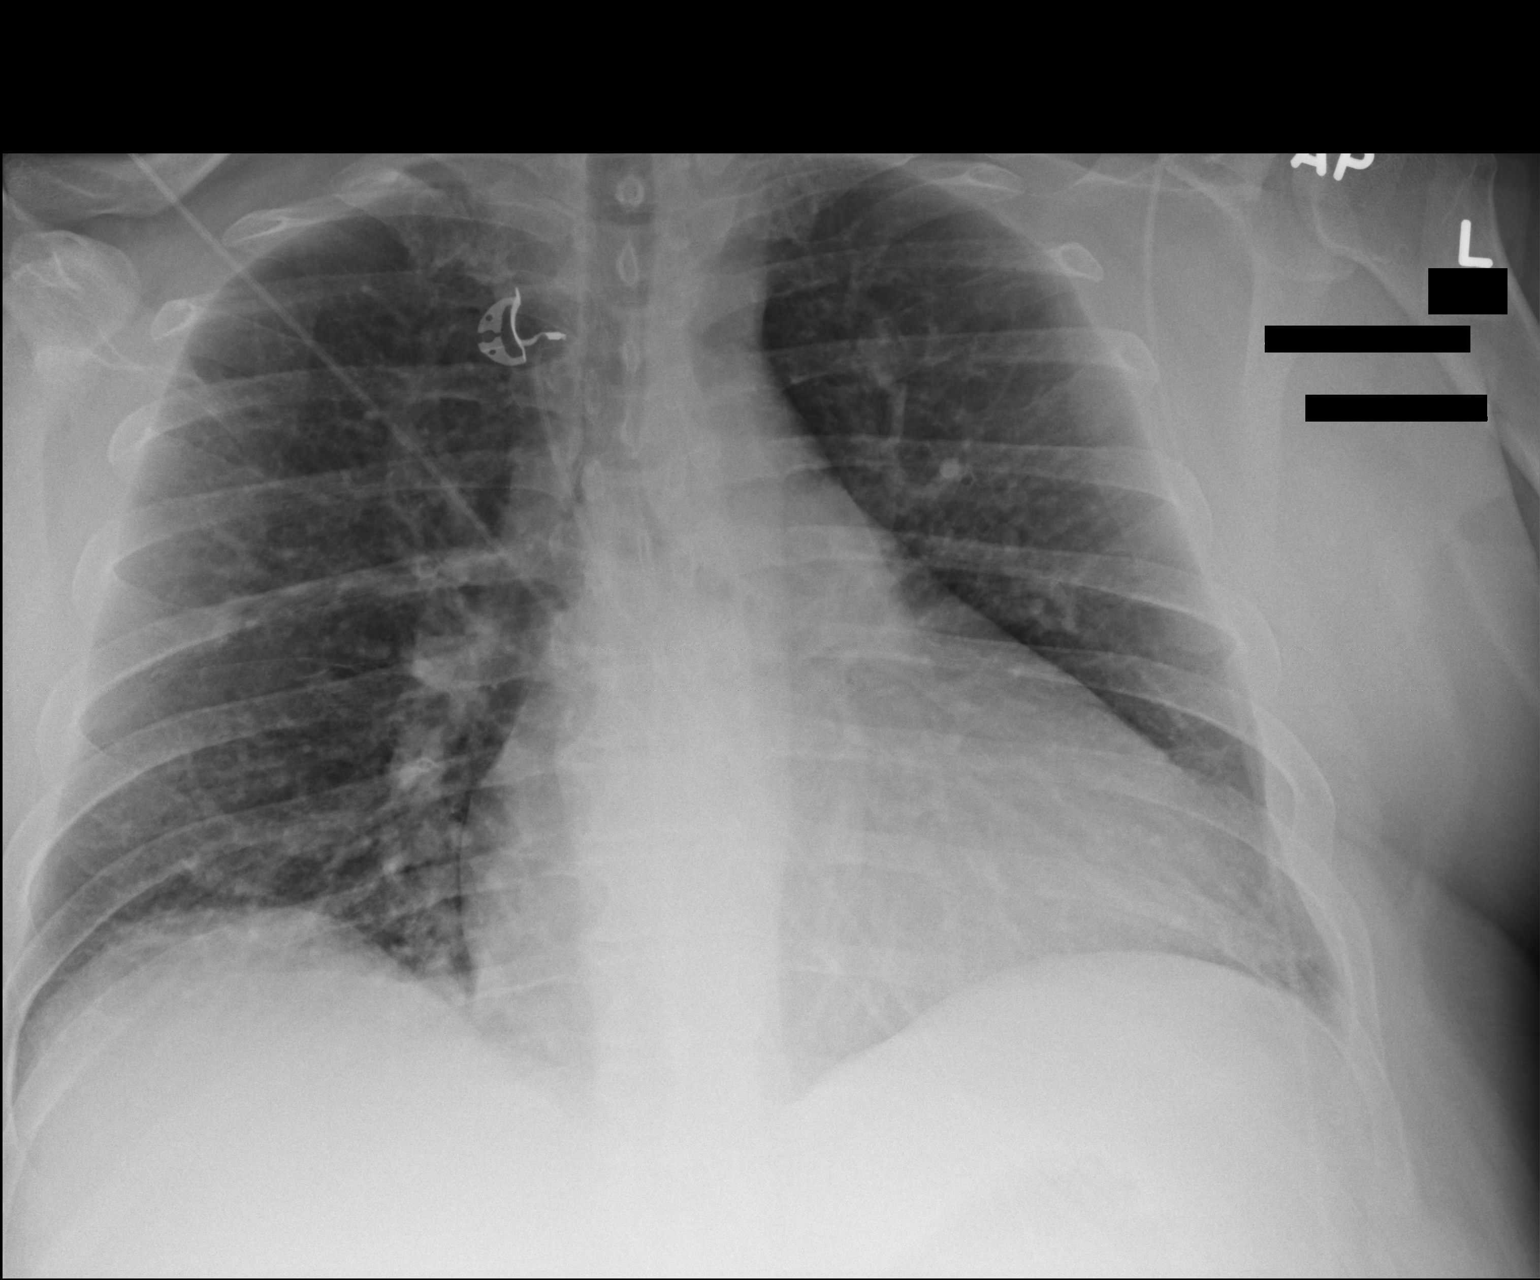

[1 of 1 positions shown; findings below may reference images not displayed]

FINDINGS: Cardiomegaly. Mild peribronchial thickening and interstitial
prominence is stable since prior study, likely chronic bronchitis.
No acute airspace opacities, effusions or edema. No acute bony
abnormality.
IMPRESSION: Cardiomegaly.

Stable peribronchial thickening and interstitial prominence, likely
chronic bronchitis.

## 2017-01-04 DEATH — deceased

## 2017-08-28 IMAGING — DX DG ABDOMEN ACUTE W/ 1V CHEST
3 series · 3 of 3 positions shown · non-contrast
Comparison: Chest radiograph performed 06/04/2015

CLINICAL DATA: Acute onset of left lower chest pain and left upper
quadrant abdominal pain. Initial encounter.

EXAM:
DG ABDOMEN ACUTE W/ 1V CHEST

[chest pa]
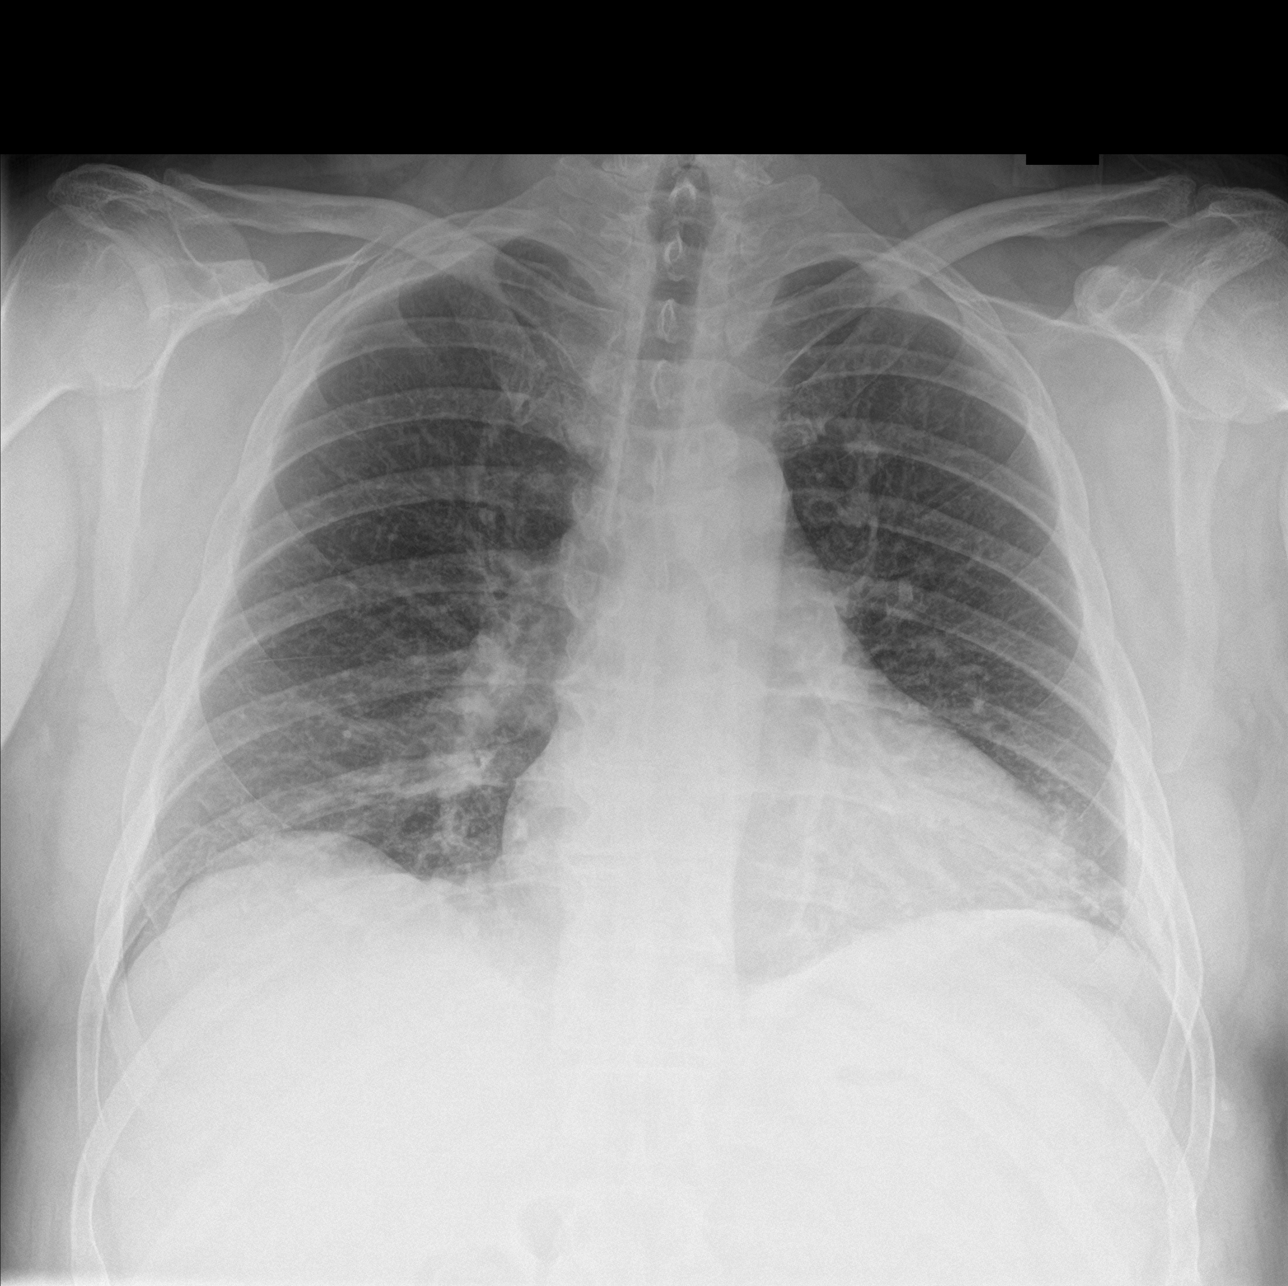

[abdomen erect]
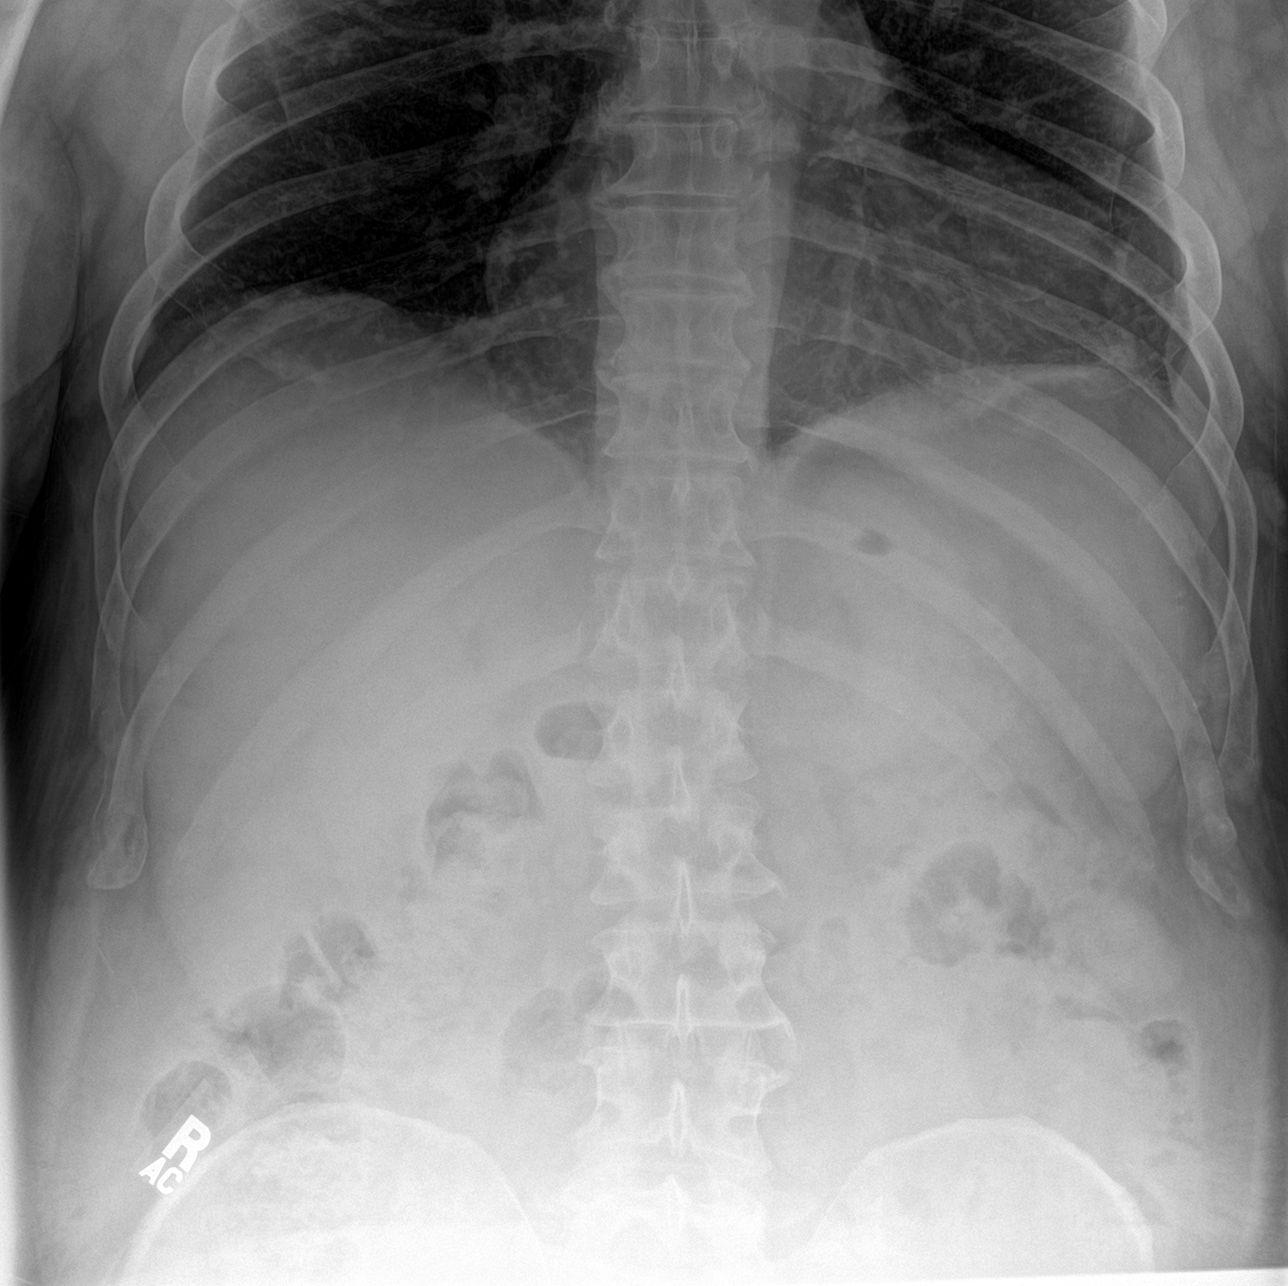

[abdomen supine]
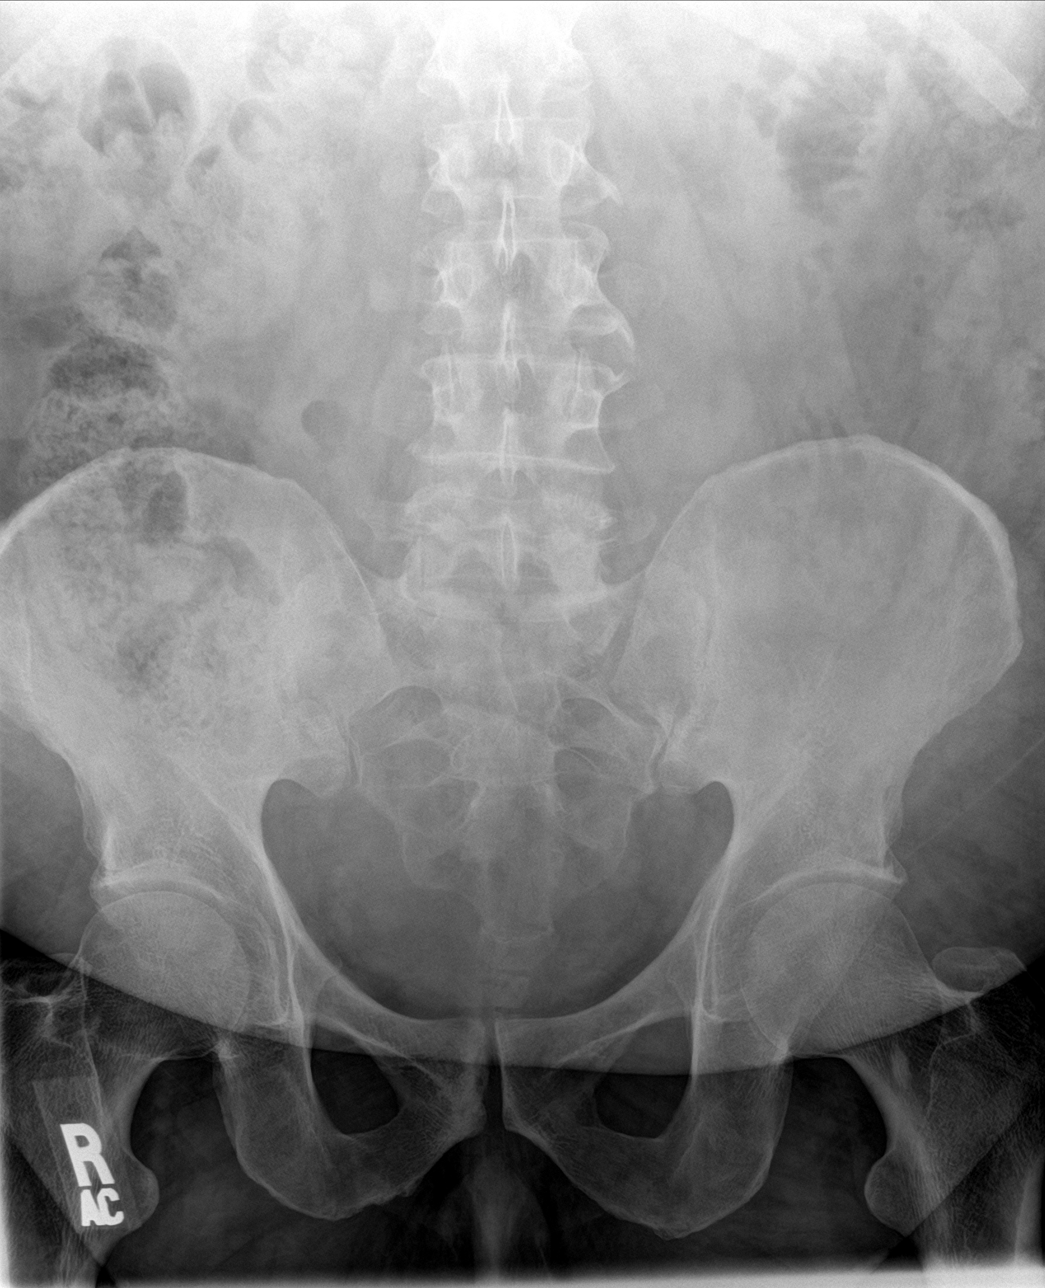

[3 of 3 positions shown; findings below may reference images not displayed]

FINDINGS: The lungs are well-aerated. Mild bibasilar opacities may reflect
mild interstitial edema or pneumonia, depending on the patient's
symptoms. There is no evidence of pleural effusion or pneumothorax.
The cardiomediastinal silhouette is borderline normal in size.

The visualized bowel gas pattern is unremarkable. Scattered stool
and air are seen within the colon; there is no evidence of small
bowel dilatation to suggest obstruction. No free intra-abdominal air
is identified on the provided upright view.

No acute osseous abnormalities are seen; the sacroiliac joints are
unremarkable in appearance.
IMPRESSION: 1. Unremarkable bowel gas pattern; no free intra-abdominal air seen.
Moderate amount of stool noted in the colon.
2. Mild bibasilar airspace opacities may reflect mild interstitial
edema or pneumonia, depending on the patient's symptoms.
